# Patient Record
Sex: Male | Born: 2000 | Race: Black or African American | Hispanic: No | Marital: Single | Smoking: Never smoker
Health system: Southern US, Community
[De-identification: ages and names within clinical notes are randomized; demographics above are authoritative.]

## PROBLEM LIST (undated history)

## (undated) DIAGNOSIS — M351 Other overlap syndromes: Secondary | ICD-10-CM

## (undated) DIAGNOSIS — J984 Other disorders of lung: Secondary | ICD-10-CM

---
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
## 2021-01-06 ENCOUNTER — Other Ambulatory Visit (HOSPITAL_COMMUNITY): Payer: Self-pay | Admitting: Internal Medicine

## 2021-01-06 DIAGNOSIS — R7989 Other specified abnormal findings of blood chemistry: Secondary | ICD-10-CM

## 2021-01-19 ENCOUNTER — Ambulatory Visit (HOSPITAL_COMMUNITY)
Admission: RE | Admit: 2021-01-19 | Discharge: 2021-01-19 | Disposition: A | Discharge status: Home / Self Care | Payer: Medicaid Other | Source: Ambulatory Visit | Attending: Internal Medicine | Admitting: Internal Medicine

## 2021-01-19 ENCOUNTER — Other Ambulatory Visit: Payer: Self-pay

## 2021-01-19 DIAGNOSIS — R7989 Other specified abnormal findings of blood chemistry: Secondary | ICD-10-CM | POA: Insufficient documentation

## 2021-04-19 ENCOUNTER — Emergency Department (HOSPITAL_COMMUNITY)
Admission: EM | Admit: 2021-04-19 | Discharge: 2021-04-19 | Disposition: A | Discharge status: Home / Self Care | Payer: Medicaid Other | Attending: Emergency Medicine | Admitting: Emergency Medicine

## 2021-04-19 DIAGNOSIS — R059 Cough, unspecified: Secondary | ICD-10-CM | POA: Diagnosis present

## 2021-04-19 DIAGNOSIS — R000 Tachycardia, unspecified: Secondary | ICD-10-CM | POA: Diagnosis not present

## 2021-04-19 DIAGNOSIS — Z20822 Contact with and (suspected) exposure to covid-19: Secondary | ICD-10-CM | POA: Insufficient documentation

## 2021-04-19 DIAGNOSIS — J069 Acute upper respiratory infection, unspecified: Secondary | ICD-10-CM | POA: Insufficient documentation

## 2021-04-19 LAB — RESP PANEL BY RT-PCR (FLU A&B, COVID) ARPGX2
Influenza A by PCR: NEGATIVE
Influenza B by PCR: NEGATIVE
SARS Coronavirus 2 by RT PCR: NEGATIVE

## 2021-04-19 MED ORDER — BENZONATATE 100 MG PO CAPS: 100.0000 mg | ORAL_CAPSULE | Freq: Three times a day (TID) | ORAL | 0 refills | Status: DC

## 2021-04-19 NOTE — ED Triage Notes (Signed)
Pt to ED via POV, Cough, chest congestion, febrile, reports yellow mucous, ongoing past 3 days. No known sick contacts.

## 2021-04-19 NOTE — Discharge Instructions (Signed)
Likely have a upper viral respiratory infection.  Respiratory panel today was negative for COVID or flu you may have one of the other viruses we did not check for.  Continue taking Tylenol or Motrin for fever control.  I have sent Tessalon pearls for your cough.  If you develop nausea vomiting to the point you are unable to keep any food or drink down please return to the emergency room.

## 2021-04-19 NOTE — ED Provider Notes (Signed)
[facility] EMERGENCY DEPARTMENT Provider Note   CSN: [id number] Arrival date & time: 04/19/21  1057     History Chief Complaint  Patient presents with   Cough   URI    Tevion Nienhuis is a 20 y.o. male.  20 year old male presents today for evaluation of fever, cough, headache, sore throat of 3-day duration.  Patient denies any muscle aches, weakness, nausea, vomiting.  Reports he is drinking fluids but has not increase his fluid intake.  Has not taken anything over-the-counter prior to arrival.  The history is provided by the patient. No language interpreter was used.  URI Presenting symptoms: cough, fever and sore throat   Associated symptoms: headaches   Associated symptoms: no myalgias       No past medical history on file.  There are no problems to display for this patient.   No past surgical history on file.     No family history on file.  Social History   Tobacco Use   Smoking status: Never   Smokeless tobacco: Never    Home Medications Prior to Admission medications   Medication Sig Start Date End Date Taking? Authorizing Provider  ibuprofen (ADVIL,MOTRIN) 400 MG tablet Take 1 tablet (400 mg total) by mouth every 6 (six) hours as needed for moderate pain. 02/10/17   Fayrene Helper, PA-C  pantoprazole (PROTONIX) 20 MG tablet Take 1 tablet (20 mg total) by mouth daily. 06/18/17   Law, Waylan Boga, PA-C  sucralfate (CARAFATE) 1 GM/10ML suspension Take 10 mLs (1 g total) by mouth 4 (four) times daily -  with meals and at bedtime. 06/18/17   Emi Holes, PA-C    Allergies    Patient has no known allergies.  Review of Systems   Review of Systems  Constitutional:  Positive for chills and fever. Negative for activity change.  HENT:  Positive for sore throat.   Respiratory:  Positive for cough. Negative for shortness of breath.   Gastrointestinal:  Negative for abdominal pain, nausea and vomiting.  Musculoskeletal:  Negative for myalgias.  Neurological:   Positive for headaches. Negative for weakness.  All other systems reviewed and are negative.  Physical Exam Updated Vital Signs BP 126/81    Pulse (!) 116    Temp 99.3 F (37.4 C) (Oral)    Resp 16    Ht 6' (1.829 m)    Wt 66.7 kg    SpO2 98%    BMI 19.94 kg/m   Physical Exam Vitals and nursing note reviewed.  Constitutional:      General: He is not in acute distress.    Appearance: Normal appearance. He is not ill-appearing.  HENT:     Head: Normocephalic and atraumatic.     Nose: Nose normal.  Eyes:     Conjunctiva/sclera: Conjunctivae normal.  Cardiovascular:     Rate and Rhythm: Regular rhythm. Tachycardia present.  Pulmonary:     Effort: Pulmonary effort is normal. No respiratory distress.     Breath sounds: No wheezing.  Musculoskeletal:        General: No deformity.     Cervical back: Normal range of motion.  Skin:    Findings: No rash.  Neurological:     Mental Status: He is alert.    ED Results / Procedures / Treatments   Labs (all labs ordered are listed, but only abnormal results are displayed) Labs Reviewed  RESP PANEL BY RT-PCR (FLU A&B, COVID) [id number]    EKG None  Radiology No results  found.  Procedures Procedures   Medications Ordered in ED Medications - No data to display  ED Course  I have reviewed the triage vital signs and the nursing notes.  Pertinent labs & imaging results that were available during my care of the patient were reviewed by me and considered in my medical decision making (see chart for details).    MDM Rules/Calculators/A&P                         20 year old male presents today for evaluation of URI symptoms of 3-day duration.  Patient's respiratory panel was negative for COVID or flu.  Patient is tachycardic.  I did discuss IV hydration however mom states she has another child and has an appointment and needs to get going.  Given that patient is not nauseous and has not vomiting he can hydrate via p.o. fluids.  Patient  is without active fever.  Return precautions discussed.  Mom and patient voiced understanding and are in agreement with plan.     Final Clinical Impression(s) / ED Diagnoses Final diagnoses:  Viral URI with cough    Rx / DC Orders ED Discharge Orders          Ordered    benzonatate (TESSALON) 100 MG capsule  Every 8 hours        04/19/21 1356             Marita Kansas, PA-C 04/19/21 1357    Mancel Bale, MD 04/19/21 1929

## 2021-06-11 ENCOUNTER — Other Ambulatory Visit (HOSPITAL_COMMUNITY): Payer: Self-pay | Admitting: Geriatric Medicine

## 2021-06-11 DIAGNOSIS — K754 Autoimmune hepatitis: Secondary | ICD-10-CM

## 2021-06-22 ENCOUNTER — Other Ambulatory Visit: Payer: Self-pay | Admitting: Radiology

## 2021-06-23 ENCOUNTER — Ambulatory Visit (HOSPITAL_COMMUNITY): Admission: RE | Admit: 2021-06-23 | Payer: Medicaid Other | Source: Ambulatory Visit

## 2021-06-23 ENCOUNTER — Encounter (HOSPITAL_COMMUNITY): Payer: Self-pay

## 2021-06-29 ENCOUNTER — Other Ambulatory Visit: Payer: Self-pay | Admitting: Student

## 2021-06-30 ENCOUNTER — Ambulatory Visit (HOSPITAL_COMMUNITY)
Admission: RE | Admit: 2021-06-30 | Discharge: 2021-06-30 | Disposition: A | Discharge status: Home / Self Care | Payer: Medicaid Other | Source: Ambulatory Visit | Attending: Geriatric Medicine | Admitting: Geriatric Medicine

## 2021-06-30 ENCOUNTER — Encounter (HOSPITAL_COMMUNITY): Payer: Self-pay

## 2021-06-30 NOTE — H&P (Signed)
Chief Complaint: Patient was seen in consultation today for random liver biopsy  at the request of Menon,Radha  Referring Physician(s): Menon,Radha  Supervising Physician: Oley Balm  Patient Status: [facility] - Out-pt  History of Present Illness: David [name] is a 21 y.o. male w/ no significant medical history had recent abnormal LFTs and positive ANA. He has been referred to IR by Dr. Carney Harder for random liver biopsy to r/o possible autoimmune hepatitis.   Allergies: Patient has no known allergies.  Medications: Prior to Admission medications   Medication Sig Start Date End Date Taking? Authorizing Provider  amitriptyline (ELAVIL) 25 MG tablet Take 25 mg by mouth at bedtime.    [provider]  SUMAtriptan (IMITREX) 50 MG tablet Take 50 mg by mouth every 2 (two) hours as needed for migraine. May repeat in 2 hours if headache persists or recurs.    [provider]     No family history on file.  Social History   Socioeconomic History   Marital status: Single    Spouse name: Not on file   Number of children: Not on file   Years of education: Not on file   Highest education level: Not on file  Occupational History   Not on file  Tobacco Use   Smoking status: Never   Smokeless tobacco: Never  Substance and Sexual Activity   Alcohol use: Not on file   Drug use: Not on file   Sexual activity: Not on file  Other Topics Concern   Not on file  Social History Narrative   Not on file   Social Determinants of Health   Financial Resource Strain: Not on file  Food Insecurity: Not on file  Transportation Needs: Not on file  Physical Activity: Not on file  Stress: Not on file  Social Connections: Not on file    Review of Systems: A 12 point ROS discussed and pertinent positives are indicated in the HPI above.  All other systems are negative.  Review of Systems  All other systems reviewed and are negative.  Vital Signs: There were no vitals  taken for this visit.  Physical Exam Constitutional:      Appearance: Normal appearance.  HENT:     Head: Normocephalic and atraumatic.     Mouth/Throat:     Mouth: Mucous membranes are moist.     Pharynx: Oropharynx is clear.  Eyes:     Extraocular Movements: Extraocular movements intact.     Pupils: Pupils are equal, round, and reactive to light.  Cardiovascular:     Rate and Rhythm: Normal rate and regular rhythm.     Pulses: Normal pulses.     Heart sounds: Normal heart sounds.  Pulmonary:     Effort: No respiratory distress.     Breath sounds: Normal breath sounds.  Abdominal:     General: Bowel sounds are normal. There is no distension.     Tenderness: There is no abdominal tenderness. There is no guarding.  Musculoskeletal:     Right lower leg: No edema.     Left lower leg: No edema.  Skin:    General: Skin is warm and dry.  Neurological:     Mental Status: He is alert and oriented to person, place, and time.  Psychiatric:        Mood and Affect: Mood normal.        Thought Content: Thought content normal.        Judgment: Judgment normal.  Imaging: No results found.  Labs:  CBC: No results for input(s): WBC, HGB, HCT, PLT in the last 8760 hours.  COAGS: No results for input(s): INR, APTT in the last 8760 hours.  BMP: No results for input(s): NA, K, CL, CO2, GLUCOSE, BUN, CALCIUM, CREATININE, GFRNONAA, GFRAA in the last 8760 hours.  Invalid input(s): CMP  LIVER FUNCTION TESTS: No results for input(s): BILITOT, AST, ALT, ALKPHOS, PROT, ALBUMIN in the last 8760 hours.  TUMOR MARKERS: No results for input(s): AFPTM, CEA, CA199, CHROMGRNA in the last 8760 hours.  Assessment and Plan: Pt with no significant medical history had  recent abnormal LFTs and positive ANA. He has been referred to IR by Dr. Carney Harder for random liver biopsy to r/o possible autoimmune hepatitis   Pt sitting upright on stretcher.  He is A&O, calm and pleasant. He is in no  distress.  Pt states he is NPO per order. He denies the use of blood thinning medications.  Risks and benefits of random liver biopsy was discussed with the patient and/or patient's family including, but not limited to bleeding, infection, damage to adjacent structures or low yield requiring additional tests.  All of the questions were answered and there is agreement to proceed.  Consent signed and in chart.   Thank you for this interesting consult.  I greatly enjoyed meeting David [name] and look forward to participating in their care.  A copy of this report was sent to the requesting provider on this date.  Electronically Signed: Shon Hough, NP 07/06/2021, 11:26 AM   I spent a total of 20 minutes in face to face in clinical consultation, greater than 50% of which was counseling/coordinating care for random liver biopsy.

## 2021-07-05 ENCOUNTER — Other Ambulatory Visit: Payer: Self-pay | Admitting: Radiology

## 2021-07-06 ENCOUNTER — Ambulatory Visit (HOSPITAL_COMMUNITY)
Admission: RE | Admit: 2021-07-06 | Discharge: 2021-07-06 | Disposition: A | Discharge status: Home / Self Care | Payer: Medicaid Other | Source: Ambulatory Visit | Attending: Geriatric Medicine | Admitting: Geriatric Medicine

## 2021-07-06 ENCOUNTER — Encounter (HOSPITAL_COMMUNITY): Payer: Self-pay

## 2021-07-06 ENCOUNTER — Other Ambulatory Visit: Payer: Self-pay

## 2021-07-06 DIAGNOSIS — K754 Autoimmune hepatitis: Secondary | ICD-10-CM | POA: Insufficient documentation

## 2021-07-06 LAB — CBC
HCT: 39.7 % (ref 39.0–52.0)
Hemoglobin: 13.1 g/dL (ref 13.0–17.0)
MCH: 29.5 pg (ref 26.0–34.0)
MCHC: 33 g/dL (ref 30.0–36.0)
MCV: 89.4 fL (ref 80.0–100.0)
Platelets: 146 10*3/uL — ABNORMAL LOW (ref 150–400)
RBC: 4.44 MIL/uL (ref 4.22–5.81)
RDW: 14.6 % (ref 11.5–15.5)
WBC: 6 10*3/uL (ref 4.0–10.5)
nRBC: 0 % (ref 0.0–0.2)

## 2021-07-06 LAB — PROTIME-INR
INR: 1.2 (ref 0.8–1.2)
Prothrombin Time: 14.9 seconds (ref 11.4–15.2)

## 2021-07-06 MED ORDER — FENTANYL CITRATE (PF) 100 MCG/2ML IJ SOLN
INTRAMUSCULAR | Status: AC
  Filled 2021-07-06: qty 2

## 2021-07-06 MED ORDER — SODIUM CHLORIDE 0.9 % IV SOLN: INTRAVENOUS | Status: DC

## 2021-07-06 MED ORDER — MIDAZOLAM HCL 2 MG/2ML IJ SOLN
INTRAMUSCULAR | Status: AC | PRN
  Administered 2021-07-06: 1 mg via INTRAVENOUS
  Administered 2021-07-06: .5 mg via INTRAVENOUS

## 2021-07-06 MED ORDER — MIDAZOLAM HCL 2 MG/2ML IJ SOLN
INTRAMUSCULAR | Status: AC
  Filled 2021-07-06: qty 2

## 2021-07-06 MED ORDER — LIDOCAINE HCL (PF) 1 % IJ SOLN
INTRAMUSCULAR | Status: AC
  Filled 2021-07-06: qty 30

## 2021-07-06 MED ORDER — FENTANYL CITRATE (PF) 100 MCG/2ML IJ SOLN
INTRAMUSCULAR | Status: AC | PRN
  Administered 2021-07-06 (×2): 25 ug via INTRAVENOUS

## 2021-07-06 MED ORDER — HYDROCODONE-ACETAMINOPHEN 5-325 MG PO TABS: 1.0000 | ORAL_TABLET | ORAL | Status: DC | PRN

## 2021-07-06 MED ORDER — GELATIN ABSORBABLE 12-7 MM EX MISC
CUTANEOUS | Status: AC
  Filled 2021-07-06: qty 1

## 2021-07-06 NOTE — Sedation Documentation (Signed)
EtCO2 monitor is not working in [place]. Unable to monitor EtCO2 for procedure. [place] supervisor made aware.  ?

## 2021-07-06 NOTE — Procedures (Signed)
  Procedure: US core liver biopsy   EBL:   minimal Complications:  none immediate  See full dictation in [facility] PACS.  D. Akosua Constantine MD Main # [phone] Pager  [phone] Mobile [phone]     

## 2021-07-08 LAB — SURGICAL PATHOLOGY

## 2021-10-21 ENCOUNTER — Other Ambulatory Visit: Payer: Self-pay

## 2021-10-21 ENCOUNTER — Encounter: Payer: Self-pay | Admitting: Internal Medicine

## 2021-10-21 ENCOUNTER — Ambulatory Visit (INDEPENDENT_AMBULATORY_CARE_PROVIDER_SITE_OTHER): Payer: Medicaid Other | Admitting: Internal Medicine

## 2021-10-21 DIAGNOSIS — B2790 Infectious mononucleosis, unspecified without complication: Secondary | ICD-10-CM | POA: Insufficient documentation

## 2021-10-21 DIAGNOSIS — R634 Abnormal weight loss: Secondary | ICD-10-CM | POA: Diagnosis not present

## 2021-10-21 DIAGNOSIS — R748 Abnormal levels of other serum enzymes: Secondary | ICD-10-CM | POA: Insufficient documentation

## 2021-10-21 NOTE — Progress Notes (Signed)
[facility]  Reason for Consult: Possible recent David [name] Referring Provider: Alesia Morin, ANP  Assessment: His EBV serology reveals [name] at some time in the past but I am not convinced that that is the cause of his recent liver enzyme elevation, unintentional weight loss, fever, fatigue, cough with hemoptysis, neuropathy and arthralgias.  He would be unusual for EBV to cause such severe and protracted symptoms.  I will repeat his liver enzymes today and he has signed a release of information to see if we can get complete records from his primary care physician and [facility].  Plan: CBC, CMP, HIV viral load and RPR Attempt to obtain all recent medical records Follow-up in 2 weeks  Patient Active Problem List   Diagnosis Date Noted   David [name] 10/21/2021    Patient's Medications  New Prescriptions   No medications on file  Previous Medications   AMITRIPTYLINE (ELAVIL) 25 MG TABLET    Take 25 mg by mouth at bedtime.   SUMATRIPTAN (IMITREX) 50 MG TABLET    Take 50 mg by mouth every 2 (two) hours as needed for migraine. May repeat in 2 hours if headache persists or recurs.  Modified Medications   No medications on file  Discontinued Medications   No medications on file    HPI: David [name] is a 21 y.o. male who emigrated here from his native [place] when he was 57 years old.  His mother tells me that when he was very little he had lots of "tummy aches".  About 5 years ago he began to struggle with depression and migraine headaches.  About 3 years ago he also had an episode of severe abdominal pain.  He was evaluated on 1 occasion and was told that he had an ulcer.  He was given some liquid medicine to take in his pain slowly improved.  He does not recall having any scans and never underwent EGD.  At some point last year he thinks he was started on sumatriptan and amitriptyline for his  headaches.  His headaches became less frequent and severe but he was told to stop the medications when it was noted that his liver enzymes were elevated last year.  He underwent an abdominal ultrasound in September that did not reveal any abnormalities.  He says that he underwent other testing and that everything was negative for hepatitis.  He was referred to [facility] where he underwent further testing.  A liver biopsy was performed in March which was essentially normal.  He underwent a CT scan of his abdomen and pelvis recently that showed only a small hiatal hernia but was otherwise normal.  He tells me that he has not felt completely well for the last 4 years.  He says that he started to feel worse in January of last year.  In addition to the headaches and elevated liver enzymes he has been losing weight unintentionally.  He says that he used to weigh 160 pounds but now he has stabilized around 130 pounds even though he is eating more to try to gain weight.  He has not had any nausea, vomiting or diarrhea.  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  He has continued taking courses as a sophomore at [employer].  For the last several months he had  episodes of fever, chills and sweats but he believes they have stopped recently.  He was also having problems with cough, chest pain and occasional hemoptysis but he thinks this has stopped as well.  He has also had diffuse aching in his joints.  He used to smoke marijuana but stopped abruptly several months ago.  He denies use of any other recreational drugs.  He has been sexually active but denies ever having any known sexually transmitted infections.  He recently had 2 visits at the cancer center at [facility].  His most recent liver enzymes are down slightly.  However his LDH was quite elevated at 760. His albumin was low and his total protein was elevated.  His  HIV antigen and antibody tests are negative.  He tells me that he believes he had a normal chest x-ray as well.  Epstein-Barr virus serology was obtained which showed a negative viral capsid antigen IgM, positive viral capsid antigen IgG and positive nuclear antigen IgG.  He was referred here for further evaluation.  Review of Systems: Review of Systems  Constitutional:  Positive for chills, diaphoresis, fever, malaise/fatigue and weight loss.  HENT:  Negative for congestion and sore throat.   Eyes:  Negative for blurred vision and redness.  Respiratory:  Positive for cough, hemoptysis and sputum production. Negative for shortness of breath and wheezing.   Cardiovascular:  Positive for chest pain.  Gastrointestinal:  Negative for abdominal pain, diarrhea, nausea and vomiting.  Genitourinary:  Negative for dysuria.  Musculoskeletal:  Positive for joint pain. Negative for myalgias.  Skin:  Negative for rash.  Neurological:  Positive for tingling and headaches. Negative for focal weakness.  Psychiatric/Behavioral:  Positive for depression. The patient is not nervous/anxious and does not have insomnia.       No past medical history on file.  Social History   Tobacco Use   Smoking status: Never   Smokeless tobacco: Never    No family history on file. No Known Allergies  OBJECTIVE: Vitals:   10/21/21 1440  Weight: 130 lb (59 kg)   Body mass index is 18.13 kg/m.   Physical Exam Constitutional:      Comments: He is soft-spoken and pleasant.  He is accompanied by his mother.  HENT:     Mouth/Throat:     Mouth: Mucous membranes are moist.     Pharynx: Oropharynx is clear. No oropharyngeal exudate or posterior oropharyngeal erythema.  Eyes:     Conjunctiva/sclera: Conjunctivae normal.     Pupils: Pupils are equal, round, and reactive to light.  Cardiovascular:     Rate and Rhythm: Normal rate and regular rhythm.     Heart sounds: No murmur heard. Pulmonary:     Effort:  Pulmonary effort is normal.     Breath sounds: Normal breath sounds. No wheezing, rhonchi or rales.  Abdominal:     Palpations: Abdomen is soft.     Tenderness: There is no abdominal tenderness.  Musculoskeletal:        General: No swelling or tenderness.     Cervical back: Neck supple.     Right lower leg: No edema.     Left lower leg: No edema.  Lymphadenopathy:     Head:     Right side of head: No submental or submandibular adenopathy.     Left side of head: No submental or submandibular adenopathy.     Cervical: No cervical adenopathy.     Upper Body:     Right upper body:  Axillary adenopathy and epitrochlear adenopathy present. No supraclavicular adenopathy.     Left upper body: Axillary adenopathy and epitrochlear adenopathy present. No supraclavicular adenopathy.  Skin:    Findings: No rash.  Neurological:     General: No focal deficit present.  Psychiatric:        Mood and Affect: Mood normal.     Microbiology: No results found for this or any previous visit (from the past 240 hour(s)).  Cliffton Asters, MD [facility] [phone] pager   [phone] cell 10/21/2021, 2:42 PM

## 2021-10-23 LAB — CBC
HCT: 38.7 % (ref 38.5–50.0)
Hemoglobin: 13 g/dL — ABNORMAL LOW (ref 13.2–17.1)
MCH: 28.9 pg (ref 27.0–33.0)
MCHC: 33.6 g/dL (ref 32.0–36.0)
MCV: 86 fL (ref 80.0–100.0)
MPV: 10 fL (ref 7.5–12.5)
Platelets: 179 10*3/uL (ref 140–400)
RBC: 4.5 10*6/uL (ref 4.20–5.80)
RDW: 14.5 % (ref 11.0–15.0)
WBC: 6.5 10*3/uL (ref 3.8–10.8)

## 2021-10-23 LAB — COMPREHENSIVE METABOLIC PANEL
AG Ratio: 0.7 (calc) — ABNORMAL LOW (ref 1.0–2.5)
ALT: 45 U/L (ref 8–46)
AST: 71 U/L — ABNORMAL HIGH (ref 12–32)
Albumin: 3.7 g/dL (ref 3.6–5.1)
Alkaline phosphatase (APISO): 68 U/L (ref 46–169)
BUN/Creatinine Ratio: 20 (calc) (ref 6–22)
BUN: 9 mg/dL (ref 7–20)
CO2: 27 mmol/L (ref 20–32)
Calcium: 9.2 mg/dL (ref 8.9–10.4)
Chloride: 103 mmol/L (ref 98–110)
Creat: 0.46 mg/dL — ABNORMAL LOW (ref 0.60–1.24)
Globulin: 5.5 g/dL (calc) — ABNORMAL HIGH (ref 2.1–3.5)
Glucose, Bld: 80 mg/dL (ref 65–99)
Potassium: 4.2 mmol/L (ref 3.8–5.1)
Sodium: 135 mmol/L (ref 135–146)
Total Bilirubin: 0.3 mg/dL (ref 0.2–1.1)
Total Protein: 9.2 g/dL — ABNORMAL HIGH (ref 6.3–8.2)

## 2021-10-23 LAB — HIV-1 RNA QUANT-NO REFLEX-BLD
HIV 1 RNA Quant: NOT DETECTED Copies/mL
HIV-1 RNA Quant, Log: NOT DETECTED Log cps/mL

## 2021-10-23 LAB — RPR: RPR Ser Ql: NONREACTIVE

## 2021-11-04 DIAGNOSIS — R5383 Other fatigue: Secondary | ICD-10-CM | POA: Insufficient documentation

## 2021-11-04 DIAGNOSIS — R768 Other specified abnormal immunological findings in serum: Secondary | ICD-10-CM | POA: Insufficient documentation

## 2021-11-04 DIAGNOSIS — R042 Hemoptysis: Secondary | ICD-10-CM | POA: Insufficient documentation

## 2021-11-04 DIAGNOSIS — Z8669 Personal history of other diseases of the nervous system and sense organs: Secondary | ICD-10-CM | POA: Insufficient documentation

## 2021-11-04 DIAGNOSIS — R509 Fever, unspecified: Secondary | ICD-10-CM | POA: Insufficient documentation

## 2021-11-04 DIAGNOSIS — R591 Generalized enlarged lymph nodes: Secondary | ICD-10-CM | POA: Insufficient documentation

## 2021-11-04 DIAGNOSIS — M2550 Pain in unspecified joint: Secondary | ICD-10-CM | POA: Insufficient documentation

## 2021-11-04 DIAGNOSIS — R7402 Elevation of levels of lactic acid dehydrogenase (LDH): Secondary | ICD-10-CM | POA: Insufficient documentation

## 2021-11-09 ENCOUNTER — Ambulatory Visit: Payer: Medicaid Other | Admitting: Internal Medicine

## 2021-11-09 DIAGNOSIS — R509 Fever, unspecified: Secondary | ICD-10-CM

## 2021-11-09 DIAGNOSIS — R5383 Other fatigue: Secondary | ICD-10-CM

## 2021-11-09 DIAGNOSIS — R042 Hemoptysis: Secondary | ICD-10-CM

## 2021-11-09 DIAGNOSIS — R591 Generalized enlarged lymph nodes: Secondary | ICD-10-CM

## 2021-11-09 DIAGNOSIS — R768 Other specified abnormal immunological findings in serum: Secondary | ICD-10-CM

## 2021-11-09 DIAGNOSIS — Z8669 Personal history of other diseases of the nervous system and sense organs: Secondary | ICD-10-CM

## 2021-11-09 DIAGNOSIS — R7402 Elevation of levels of lactic acid dehydrogenase (LDH): Secondary | ICD-10-CM

## 2021-11-09 DIAGNOSIS — M25541 Pain in joints of right hand: Secondary | ICD-10-CM

## 2021-11-23 ENCOUNTER — Ambulatory Visit: Payer: Self-pay | Admitting: Internal Medicine

## 2021-12-04 ENCOUNTER — Observation Stay (HOSPITAL_COMMUNITY): Payer: Self-pay

## 2021-12-04 ENCOUNTER — Encounter (HOSPITAL_COMMUNITY): Payer: Self-pay | Admitting: Emergency Medicine

## 2021-12-04 ENCOUNTER — Other Ambulatory Visit: Payer: Self-pay

## 2021-12-04 ENCOUNTER — Emergency Department (HOSPITAL_COMMUNITY): Payer: Self-pay

## 2021-12-04 ENCOUNTER — Encounter (HOSPITAL_COMMUNITY): Payer: Self-pay

## 2021-12-04 ENCOUNTER — Observation Stay (HOSPITAL_COMMUNITY)
Admission: EM | Admit: 2021-12-04 | Discharge: 2021-12-05 | Disposition: A | Discharge status: Home / Self Care | Payer: Self-pay | Attending: Family Medicine | Admitting: Family Medicine

## 2021-12-04 DIAGNOSIS — R509 Fever, unspecified: Secondary | ICD-10-CM | POA: Diagnosis present

## 2021-12-04 DIAGNOSIS — R0789 Other chest pain: Principal | ICD-10-CM | POA: Insufficient documentation

## 2021-12-04 DIAGNOSIS — R079 Chest pain, unspecified: Secondary | ICD-10-CM | POA: Diagnosis present

## 2021-12-04 DIAGNOSIS — R778 Other specified abnormalities of plasma proteins: Secondary | ICD-10-CM | POA: Insufficient documentation

## 2021-12-04 DIAGNOSIS — R634 Abnormal weight loss: Secondary | ICD-10-CM | POA: Insufficient documentation

## 2021-12-04 DIAGNOSIS — M2550 Pain in unspecified joint: Secondary | ICD-10-CM | POA: Diagnosis present

## 2021-12-04 DIAGNOSIS — M26629 Arthralgia of temporomandibular joint, unspecified side: Secondary | ICD-10-CM | POA: Insufficient documentation

## 2021-12-04 DIAGNOSIS — Z20822 Contact with and (suspected) exposure to covid-19: Secondary | ICD-10-CM | POA: Insufficient documentation

## 2021-12-04 DIAGNOSIS — B349 Viral infection, unspecified: Secondary | ICD-10-CM | POA: Insufficient documentation

## 2021-12-04 DIAGNOSIS — R748 Abnormal levels of other serum enzymes: Secondary | ICD-10-CM | POA: Insufficient documentation

## 2021-12-04 DIAGNOSIS — R531 Weakness: Secondary | ICD-10-CM | POA: Insufficient documentation

## 2021-12-04 LAB — CBC WITH DIFFERENTIAL/PLATELET
Abs Immature Granulocytes: 0.08 10*3/uL — ABNORMAL HIGH (ref 0.00–0.07)
Basophils Absolute: 0 10*3/uL (ref 0.0–0.1)
Basophils Relative: 0 %
Eosinophils Absolute: 0 10*3/uL (ref 0.0–0.5)
Eosinophils Relative: 0 %
HCT: 36.7 % — ABNORMAL LOW (ref 39.0–52.0)
Hemoglobin: 12.1 g/dL — ABNORMAL LOW (ref 13.0–17.0)
Immature Granulocytes: 1 %
Lymphocytes Relative: 20 %
Lymphs Abs: 3.5 10*3/uL (ref 0.7–4.0)
MCH: 28.4 pg (ref 26.0–34.0)
MCHC: 33 g/dL (ref 30.0–36.0)
MCV: 86.2 fL (ref 80.0–100.0)
Monocytes Absolute: 1.3 10*3/uL — ABNORMAL HIGH (ref 0.1–1.0)
Monocytes Relative: 8 %
Neutro Abs: 12.7 10*3/uL — ABNORMAL HIGH (ref 1.7–7.7)
Neutrophils Relative %: 71 %
Platelets: 188 10*3/uL (ref 150–400)
RBC: 4.26 MIL/uL (ref 4.22–5.81)
RDW: 14.6 % (ref 11.5–15.5)
WBC: 17.6 10*3/uL — ABNORMAL HIGH (ref 4.0–10.5)
nRBC: 0 % (ref 0.0–0.2)

## 2021-12-04 LAB — URINALYSIS, ROUTINE W REFLEX MICROSCOPIC
Bilirubin Urine: NEGATIVE
Glucose, UA: NEGATIVE mg/dL
Hgb urine dipstick: NEGATIVE
Ketones, ur: 80 mg/dL — AB
Leukocytes,Ua: NEGATIVE
Nitrite: NEGATIVE
Protein, ur: 100 mg/dL — AB
Specific Gravity, Urine: 1.019 (ref 1.005–1.030)
pH: 6 (ref 5.0–8.0)

## 2021-12-04 LAB — C-REACTIVE PROTEIN: CRP: 23.6 mg/dL — ABNORMAL HIGH (ref <1.0)

## 2021-12-04 LAB — COMPREHENSIVE METABOLIC PANEL
ALT: 54 U/L — ABNORMAL HIGH (ref 0–44)
AST: 69 U/L — ABNORMAL HIGH (ref 15–41)
Albumin: 3.2 g/dL — ABNORMAL LOW (ref 3.5–5.0)
Alkaline Phosphatase: 67 U/L (ref 38–126)
Anion gap: 9 (ref 5–15)
BUN: 8 mg/dL (ref 6–20)
CO2: 24 mmol/L (ref 22–32)
Calcium: 8.3 mg/dL — ABNORMAL LOW (ref 8.9–10.3)
Chloride: 97 mmol/L — ABNORMAL LOW (ref 98–111)
Creatinine, Ser: 0.54 mg/dL — ABNORMAL LOW (ref 0.61–1.24)
GFR, Estimated: >60 mL/min (ref >60)
Glucose, Bld: 117 mg/dL — ABNORMAL HIGH (ref 70–99)
Potassium: 3.8 mmol/L (ref 3.5–5.1)
Sodium: 130 mmol/L — ABNORMAL LOW (ref 135–145)
Total Bilirubin: 0.8 mg/dL (ref 0.3–1.2)
Total Protein: 9.3 g/dL — ABNORMAL HIGH (ref 6.5–8.1)

## 2021-12-04 LAB — SEDIMENTATION RATE: Sed Rate: 92 mm/hr — ABNORMAL HIGH (ref 0–16)

## 2021-12-04 LAB — PROCALCITONIN: Procalcitonin: 0.13 ng/mL

## 2021-12-04 LAB — GROUP A STREP BY PCR: Group A Strep by PCR: NOT DETECTED

## 2021-12-04 LAB — PROTIME-INR
INR: 1.4 — ABNORMAL HIGH (ref 0.8–1.2)
Prothrombin Time: 16.9 seconds — ABNORMAL HIGH (ref 11.4–15.2)

## 2021-12-04 LAB — RESP PANEL BY RT-PCR (FLU A&B, COVID) ARPGX2
Influenza A by PCR: NEGATIVE
Influenza B by PCR: NEGATIVE
SARS Coronavirus 2 by RT PCR: NEGATIVE

## 2021-12-04 LAB — MONONUCLEOSIS SCREEN: Mono Screen: NEGATIVE

## 2021-12-04 LAB — TROPONIN I (HIGH SENSITIVITY)
Troponin I (High Sensitivity): 59 ng/L — ABNORMAL HIGH (ref <18)
Troponin I (High Sensitivity): 51 ng/L — ABNORMAL HIGH (ref <18)

## 2021-12-04 LAB — TSH: TSH: 1.992 u[IU]/mL (ref 0.350–4.500)

## 2021-12-04 LAB — LACTIC ACID, PLASMA: Lactic Acid, Venous: 1.1 mmol/L (ref 0.5–1.9)

## 2021-12-04 LAB — APTT: aPTT: 35 seconds (ref 24–36)

## 2021-12-04 MED ORDER — HEPARIN (PORCINE) 25000 UT/250ML-% IV SOLN
650.0000 [IU]/h | INTRAVENOUS | Status: DC
Start: 2021-12-04 — End: 2021-12-04

## 2021-12-04 MED ORDER — IOHEXOL 350 MG/ML SOLN
75.0000 mL | Freq: Once | INTRAVENOUS | Status: AC | PRN
  Administered 2021-12-04: 75 mL via INTRAVENOUS

## 2021-12-04 MED ORDER — TECHNETIUM TO 99M ALBUMIN AGGREGATED
4.0000 | Freq: Once | INTRAVENOUS | Status: AC | PRN
  Administered 2021-12-04: 4.4 via INTRAVENOUS

## 2021-12-04 MED ORDER — HEPARIN (PORCINE) 25000 UT/250ML-% IV SOLN
950.0000 [IU]/h | INTRAVENOUS | Status: DC
  Administered 2021-12-04: 950 [IU]/h via INTRAVENOUS
  Filled 2021-12-04: qty 250

## 2021-12-04 MED ORDER — ORAL CARE MOUTH RINSE: 15.0000 mL | OROMUCOSAL | Status: DC | PRN

## 2021-12-04 MED ORDER — SODIUM CHLORIDE 0.9% FLUSH
3.0000 mL | Freq: Two times a day (BID) | INTRAVENOUS | Status: DC
  Administered 2021-12-04: 3 mL via INTRAVENOUS

## 2021-12-04 MED ORDER — IBUPROFEN 800 MG PO TABS
800.0000 mg | ORAL_TABLET | Freq: Once | ORAL | Status: AC
Start: 2021-12-04 — End: 2021-12-04
  Administered 2021-12-04: 800 mg via ORAL
  Filled 2021-12-04: qty 1

## 2021-12-04 MED ORDER — MIRTAZAPINE 15 MG PO TABS
15.0000 mg | ORAL_TABLET | Freq: Every day | ORAL | Status: DC
  Administered 2021-12-04: 15 mg via ORAL
  Filled 2021-12-04: qty 1

## 2021-12-04 MED ORDER — BISACODYL 10 MG RE SUPP: 10.0000 mg | Freq: Every day | RECTAL | Status: DC | PRN

## 2021-12-04 MED ORDER — ACETAMINOPHEN 650 MG RE SUPP: 650.0000 mg | Freq: Four times a day (QID) | RECTAL | Status: DC | PRN

## 2021-12-04 MED ORDER — ONDANSETRON HCL 4 MG PO TABS: 4.0000 mg | ORAL_TABLET | Freq: Four times a day (QID) | ORAL | Status: DC | PRN

## 2021-12-04 MED ORDER — IBUPROFEN 400 MG PO TABS
800.0000 mg | ORAL_TABLET | Freq: Three times a day (TID) | ORAL | Status: DC
  Administered 2021-12-04 – 2021-12-05 (×3): 800 mg via ORAL
  Filled 2021-12-04 (×3): qty 2

## 2021-12-04 MED ORDER — LACTATED RINGERS IV BOLUS (SEPSIS)
1000.0000 mL | Freq: Once | INTRAVENOUS | Status: AC
  Administered 2021-12-04: 1000 mL via INTRAVENOUS

## 2021-12-04 MED ORDER — SODIUM CHLORIDE 0.9% FLUSH
3.0000 mL | Freq: Two times a day (BID) | INTRAVENOUS | Status: DC
Start: 2021-12-04 — End: 2021-12-05
  Administered 2021-12-04 – 2021-12-05 (×2): 3 mL via INTRAVENOUS

## 2021-12-04 MED ORDER — LACTATED RINGERS IV SOLN: INTRAVENOUS | Status: DC

## 2021-12-04 MED ORDER — POLYETHYLENE GLYCOL 3350 17 G PO PACK: 17.0000 g | PACK | Freq: Every day | ORAL | Status: DC | PRN

## 2021-12-04 MED ORDER — ONDANSETRON HCL 4 MG/2ML IJ SOLN: 4.0000 mg | Freq: Four times a day (QID) | INTRAMUSCULAR | Status: DC | PRN

## 2021-12-04 MED ORDER — ENOXAPARIN SODIUM 40 MG/0.4ML IJ SOSY
40.0000 mg | PREFILLED_SYRINGE | INTRAMUSCULAR | Status: DC
  Administered 2021-12-04: 40 mg via SUBCUTANEOUS
  Filled 2021-12-04: qty 0.4

## 2021-12-04 MED ORDER — ACETAMINOPHEN 325 MG PO TABS
650.0000 mg | ORAL_TABLET | Freq: Once | ORAL | Status: AC
  Administered 2021-12-04: 650 mg via ORAL
  Filled 2021-12-04: qty 2

## 2021-12-04 MED ORDER — ACETAMINOPHEN 325 MG PO TABS: 650.0000 mg | ORAL_TABLET | Freq: Four times a day (QID) | ORAL | Status: DC | PRN

## 2021-12-04 MED ORDER — SODIUM CHLORIDE 0.9 % IV SOLN
INTRAVENOUS | Status: DC | PRN
Start: 2021-12-04 — End: 2021-12-05

## 2021-12-04 MED ORDER — HEPARIN BOLUS VIA INFUSION
4000.0000 [IU] | Freq: Once | INTRAVENOUS | Status: AC
  Administered 2021-12-04: 4000 [IU] via INTRAVENOUS

## 2021-12-04 MED ORDER — SODIUM CHLORIDE 0.9% FLUSH: 3.0000 mL | INTRAVENOUS | Status: DC | PRN

## 2021-12-04 NOTE — ED Provider Notes (Signed)
[facility] EMERGENCY DEPARTMENT Provider Note   CSN: [id number] Arrival date & time: 12/04/21  0846     History  Chief Complaint  Patient presents with   Chest Pain    David [name] is a 21 y.o. male.   Chest Pain Associated symptoms: cough, fatigue, fever and headache   Associated symptoms: no abdominal pain, no dizziness, no nausea, no numbness, no shortness of breath, no vomiting and no weakness        David [name] is a 21 y.o. male with past medical history of migraine headaches who presents to the Emergency Department complaining of mid chest pain and arthralgias.  Symptoms began 2 days ago.  He states that he began noticing aching pains of all of his joints.  He began having chest pain shortly after.  Mild cough and mother states that he "felt feverish" yesterday.  States that he stayed in bed most of the day yesterday.  She states this is unlike him.  Patient states cough is mostly been nonproductive, chest pain worsens when lying down.  he also complains of sore throat and headache.  No shortness of breath.  Headache has been gradual in onset and frontal in location.  He states headache feels similar to prior headaches.  Denies abdominal pain, dysuria, vomiting or diarrhea.  States that his neck feels sore as well.  Denies any sick contacts, mother states no other family members or ill. No known tick bites, denies IV drug use  Home Medications Prior to Admission medications   Medication Sig Start Date End Date Taking? Authorizing Provider  amitriptyline (ELAVIL) 25 MG tablet Take 25 mg by mouth at bedtime. Patient not taking: Reported on 10/21/2021    [provider]  SUMAtriptan (IMITREX) 50 MG tablet Take 50 mg by mouth every 2 (two) hours as needed for migraine. May repeat in 2 hours if headache persists or recurs. Patient not taking: Reported on 10/21/2021    [provider]      Allergies    Patient has no known allergies.    Review of Systems    Review of Systems  Constitutional:  Positive for activity change, fatigue and fever.  HENT:  Positive for sore throat. Negative for congestion, ear pain and voice change.   Respiratory:  Positive for cough. Negative for chest tightness and shortness of breath.   Cardiovascular:  Positive for chest pain. Negative for leg swelling.  Gastrointestinal:  Negative for abdominal pain, diarrhea, nausea and vomiting.  Genitourinary:  Negative for difficulty urinating and dysuria.  Musculoskeletal:  Positive for arthralgias, neck pain and neck stiffness. Negative for joint swelling.  Skin:  Negative for rash.  Neurological:  Positive for headaches. Negative for dizziness, syncope, speech difficulty, weakness and numbness.  Psychiatric/Behavioral:  Negative for confusion.     Physical Exam Updated Vital Signs BP 128/79   Pulse (!) 140   Temp (!) 102.7 F (39.3 C) (Oral)   Resp (!) 26   Ht 6' (1.829 m)   Wt 59 kg   SpO2 100%   BMI 17.64 kg/m  Physical Exam Vitals and nursing note reviewed.  Constitutional:      Appearance: He is ill-appearing. He is not toxic-appearing.  HENT:     Mouth/Throat:     Mouth: Mucous membranes are moist.     Pharynx: Uvula midline. Posterior oropharyngeal erythema present. No pharyngeal swelling or uvula swelling.     Tonsils: No tonsillar exudate.  Eyes:     Conjunctiva/sclera: Conjunctivae  normal.     Pupils: Pupils are equal, round, and reactive to light.  Cardiovascular:     Rate and Rhythm: Regular rhythm. Tachycardia present.     Pulses: Normal pulses.  Pulmonary:     Effort: Pulmonary effort is normal. No respiratory distress.     Breath sounds: No wheezing.  Abdominal:     General: There is no distension.     Palpations: Abdomen is soft.     Tenderness: There is no abdominal tenderness.  Musculoskeletal:     Cervical back: Rigidity present.     Right lower leg: No edema.     Left lower leg: No edema.  Skin:    General: Skin is warm.      Capillary Refill: Capillary refill takes less than 2 seconds.     Findings: No rash.  Neurological:     General: No focal deficit present.     Mental Status: He is alert.     Sensory: No sensory deficit.     Motor: No weakness.     ED Results / Procedures / Treatments   Labs (all labs ordered are listed, but only abnormal results are displayed) Labs Reviewed  COMPREHENSIVE METABOLIC PANEL - Abnormal; Notable for the following components:      Result Value   Sodium 130 (*)    Chloride 97 (*)    Glucose, Bld 117 (*)    Creatinine, Ser 0.54 (*)    Calcium 8.3 (*)    Total Protein 9.3 (*)    Albumin 3.2 (*)    AST 69 (*)    ALT 54 (*)    All other components within normal limits  CBC WITH DIFFERENTIAL/PLATELET - Abnormal; Notable for the following components:   WBC 17.6 (*)    Hemoglobin 12.1 (*)    HCT 36.7 (*)    Neutro Abs 12.7 (*)    Monocytes Absolute 1.3 (*)    Abs Immature Granulocytes 0.08 (*)    All other components within normal limits  PROTIME-INR - Abnormal; Notable for the following components:   Prothrombin Time 16.9 (*)    INR 1.4 (*)    All other components within normal limits  URINALYSIS, ROUTINE W REFLEX MICROSCOPIC - Abnormal; Notable for the following components:   Ketones, ur 80 (*)    Protein, ur 100 (*)    Bacteria, UA RARE (*)    All other components within normal limits  TROPONIN I (HIGH SENSITIVITY) - Abnormal; Notable for the following components:   Troponin I (High Sensitivity) 59 (*)    All other components within normal limits  RESP PANEL BY RT-PCR (FLU A&B, COVID) ARPGX2  GROUP A STREP BY PCR  CULTURE, BLOOD (ROUTINE X 2)  CULTURE, BLOOD (ROUTINE X 2)  URINE CULTURE  LACTIC ACID, PLASMA  APTT  MONONUCLEOSIS SCREEN    EKG EKG Interpretation  Date/Time:  Saturday December 04 2021 09:01:27 EDT Ventricular Rate:  139 PR Interval:  143 QRS Duration: 89 QT Interval:  277 QTC Calculation: 422 R Axis:   81 Text Interpretation: Sinus  tachycardia Atrial premature complex Consider right atrial enlargement Borderline T wave abnormalities Borderline ST elevation, inferior leads Confirmed by Alona Bene [id number]) on 12/04/2021 9:21:48 AM  Radiology CT Angio Chest PE W and/or Wo Contrast  Result Date: 12/04/2021 CLINICAL DATA:  21 year old male with left side chest pain, joint pain. EXAM: CT ANGIOGRAPHY CHEST WITH CONTRAST TECHNIQUE: Multidetector CT imaging of the chest was performed using the standard protocol during  bolus administration of intravenous contrast. Multiplanar CT image reconstructions and MIPs were obtained to evaluate the vascular anatomy. RADIATION DOSE REDUCTION: This exam was performed according to the departmental dose-optimization program which includes automated exposure control, adjustment of the mA and/or kV according to patient size and/or use of iterative reconstruction technique. CONTRAST:  36mL OMNIPAQUE IOHEXOL 350 MG/ML SOLN COMPARISON:  Portable chest 0931 hours today. FINDINGS: Cardiovascular: Good contrast bolus timing in the pulmonary arterial tree. Respiratory motion. Difficult to exclude nonocclusive thrombus in a segmental branch of the left lower lobe on series 5, image 137. But a nearby bifurcation and the immediate downstream branches appear patent. And no other pulmonary artery filling defect is identified. Moderate circumferential pericardial effusion, up to 15 mm in thickness. Pericardial fluid measures low to intermediate density. Negative visible aorta. Mediastinum/Nodes: Reactive appearing small mediastinal and axillary lymph nodes, individually 10-12 mm short axis. Lungs/Pleura: Trace layering pleural fluid bilaterally. Mild dependent atelectasis, most pronounced in the left costophrenic angle. Major airways are patent. No pulmonary contusion or consolidation to suggest infarct or pneumonia. Upper Abdomen: Negative visible early contrast enhanced liver, spleen, pancreas, adrenal glands and kidneys.  Negative visible bowel in the upper abdomen. Musculoskeletal: Mild to moderate dextroconvex scoliosis. Otherwise negative. Review of the MIP images confirms the above findings. IMPRESSION: 1. Respiratory motion with a questionable small solitary nonocclusive pulmonary embolus in a segmental branch of the left lower lobe. But favor this is artifact. 2. Positive for up to Moderate Pericardial Effusion. Consider Pericarditis. 3. Reactive appearing axillary and mediastinal lymph nodes, and trace bilateral pleural fluid. Dependent opacity most pronounced in the left costophrenic angle. Favor atelectasis rather than pulmonary infarct or inflammation. Electronically Signed   By: Odessa Fleming M.D.   On: 12/04/2021 11:58   DG Chest Port 1 View  Result Date: 12/04/2021 CLINICAL DATA:  Questionable sepsis.  Chest pain. EXAM: PORTABLE CHEST 1 VIEW COMPARISON:  None Available. FINDINGS: The heart size and mediastinal contours are within normal limits. Both lungs are clear. The visualized skeletal structures are unremarkable. IMPRESSION: No active disease. Electronically Signed   By: Gerome Sam III M.D.   On: 12/04/2021 09:38    Procedures Procedures    Medications Ordered in ED Medications  lactated ringers infusion (has no administration in time range)  lactated ringers bolus 1,000 mL (has no administration in time range)  ibuprofen (ADVIL) tablet 800 mg (has no administration in time range)    ED Course/ Medical Decision Making/ A&P                           Medical Decision Making Patient here with complaints of mid chest pain and arthralgias with cough, headache and sore throat.  Symptoms began 2 days ago.  Subjective fever at home.  Cough mostly nonproductive.  No history of sick contacts or IV drug use  On exam, patient ill-appearing, tachycardic and febrile at 102.7.  No focal neurodeficits.  He does have a mildly erythematous oropharynx without exudate.  No evidence of peritonsillar abscess. No  definite meningeal signs.  Patient ill-appearing and concerning for developing sepsis.  His differential at this time includes but not limited to viral process, COVID, pneumonia, ACS, PE, meningitis.    Amount and/or Complexity of Data Reviewed Labs: ordered.    Details: Labs interpreted by me, show leukocytosis with white count of 17,000, hemoglobin 12.1 and platelets are reassuring.  INR 1.4.  Chemistries show mild hyponatremia with sodium of  130.  Blood sugar 117.  Kidney function without presence of AKI urinalysis without evidence of infection, lactic acid unremarkable at 1.1, COVID flu mono and strep testing all negative. Radiology: ordered.    Details: Chest x-ray without acute cardiopulmonary disease  CT angio of chest shows questionable mild nonocclusive pulmonary embolus but favored represent artifact.  Positive for moderate pericardial effusion, possible pericarditis ECG/medicine tests: ordered.    Details: EKG shows sinus tachycardia with right atrial enlargement, borderline T wave abnormalities, borderline ST elevation inferior leads Discussion of management or test interpretation with external provider(s): Discussed findings with cardiology, Dr. Ladona Ridgel.  Felt that effusion does not need drainage at this time, patient will need echocardiogram and recommends hospitalization for observation  Consulted [facility] hospitalist, Dr. Mariea Clonts who is agreeable to admit  Risk Prescription drug management.   CRITICAL CARE Performed by: Cynthia Cogle Total critical care time: 40 minutes Critical care time was exclusive of separately billable procedures and treating other patients. Critical care was necessary to treat or prevent imminent or life-threatening deterioration. Critical care was time spent personally by me on the following activities: development of treatment plan with patient and/or surrogate as well as nursing, discussions with consultants, evaluation of patient's response to  treatment, examination of patient, obtaining history from patient or surrogate, ordering and performing treatments and interventions, ordering and review of laboratory studies, ordering and review of radiographic studies, pulse oximetry and re-evaluation of patient's condition.          Final Clinical Impression(s) / ED Diagnoses Final diagnoses:  Atypical chest pain  Viral illness    Rx / DC Orders ED Discharge Orders     None         Pauline Aus, PA-C 12/04/21 1405    Long, Arlyss Repress, MD 12/05/21 1815

## 2021-12-04 NOTE — Progress Notes (Signed)
   12/04/21 2144  Assess: if the MEWS score is Yellow or Red  Were vital signs taken at a resting state? Yes  Focused Assessment No change from prior assessment  Does the patient meet 2 or more of the SIRS criteria? No  MEWS guidelines implemented *See Row Information* Yes  Treat  MEWS Interventions Administered scheduled meds/treatments  Pain Scale 0-10  Pain Score 3  Pain Type Acute pain  Pain Location Chest  Pain Orientation Left  Pain Descriptors / Indicators Aching  Pain Frequency Constant  Pain Onset On-going  Patients Stated Pain Goal 0  Multiple Pain Sites No  Take Vital Signs  Increase Vital Sign Frequency  Yellow: Q 2hr X 2 then Q 4hr X 2, if remains yellow, continue Q 4hrs  Escalate  MEWS: Escalate Yellow: discuss with charge nurse/RN and consider discussing with provider and RRT  Notify: Charge Nurse/RN  Name of Charge Nurse/RN Notified Tiffany, RN  Date Charge Nurse/RN Notified 12/04/21  Time Charge Nurse/RN Notified 2146

## 2021-12-04 NOTE — H&P (Signed)
Patient Demographics:    David [name], is a 21 y.o. male  MRN: [id number]   DOB - 11-02-2000  Admit Date - 12/04/2021  Outpatient Primary MD for the patient is Pcp, No   Assessment & Plan:   Assessment and Plan: - -CT angio of chest shows questionable mild nonocclusive pulmonary embolus but favored represent artifact.    1)FUO--- extensive but negative work-up so far please see HPI/subjective section -No abdominal pain no dyspnea -Denies tick bite denies recent travel, no history of STDs -Reports worsening sore throat/odynophagia -COVID, flu and mono negative -ESR is trending up  2) generalized weakness/anorexia/odynophagia/malaise/arthralgia----occasional fevers and chills  -- Favor inflammatory disorder over infectious disorder - malaise, fatigue, arthralgias, migraine, cough at times with hemoptysis, chest discomfort especially when he lays down, chills and fevers, unexplained night sweats, generalized weakness, and recently elevated LFTs -Patient was seen at [facility] hematology oncology in [place].... In June 2023 -He was subsequently evaluated by Dr. Michel Bickers of infectious disease at [facility] health in [place] due to concerns for elevated EBV titers. 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    Details: EKG shows sinus tachycardia with right atrial enlargement, borderline T wave abnormalities, borderline ST elevation inferior leads Discussion of management or test interpretation with external provider(s): Discussed findings with cardiology, Dr. Lovena Le.  Felt that effusion does not need drainage at this time, patient will need echocardiogram and recommends hospitalization for observation -Initially treated with IV heparin.... With negative VQ scan IV heparin has been discontinued -Check echocardiogram -Check lower extremity Dopplers  Disposition/Need for in-Hospital Stay- patient unable to be discharged at this time due to -atypical chest pain.Marland Kitchen  Possible pericardial effusion awaiting echocardiogram  Dispo: The patient is from: Home              Anticipated d/c is to: Home              Anticipated d/c date is: 1 day              Patient currently is not medically stable to d/c. Barriers: Not Clinically Stable-   With History of - Reviewed by me  History reviewed. No pertinent past medical history.    History reviewed. No pertinent surgical history.   Chief Complaint  Patient presents with   Chest Pain      HPI:    David [name]  is a 21 y.o. male with multiple complaints lasting  over 3 months now.... -Patient had malaise, fatigue, arthralgias, migraine, cough at times with hemoptysis, chest discomfort especially when he lays down, chills and fevers, unexplained night sweats, generalized weakness, and recently elevated LFTs -Patient was seen at [facility] hematology oncology in [place].... In June 2023 -He was subsequently evaluated by Dr. Michel Bickers of infectious disease at [facility] health in [place] due to concerns for elevated EBV titers. - In the ED today CT scan was inconclusive for PE,.... 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  Positive for moderate pericardial effusion, possible pericarditis ECG/medicine tests: ordered.    Details: EKG shows sinus tachycardia with right atrial enlargement, borderline T wave abnormalities, borderline ST elevation inferior leads Discussion of management or test interpretation with external provider(s): Discussed findings with cardiology, Dr. Lovena Le.  Felt that effusion does not need drainage at this time, patient will need echocardiogram and recommends hospitalization for observation      Review of systems:    In addition to the HPI above,   A full Review of  Systems was done, all other systems reviewed are negative except as noted above in HPI , .    Social History:  Reviewed by me    Social History   Tobacco Use   Smoking status: Never   Smokeless tobacco: Never  Substance Use Topics   Alcohol use: Not Currently     Family History :  Reviewed by me  -Grandmother with rheumatoid arthritis   Home Medications:   Prior to Admission medications   Medication Sig Start Date End Date Taking? Authorizing Provider  ibuprofen (ADVIL) 600 MG tablet Take 600 mg  by mouth every 6 (six) hours as needed. 08/17/21  Yes [provider]     Allergies:     Allergies  Allergen Reactions   Fish Allergy Itching     Physical Exam:   Vitals  Blood pressure 111/74, pulse 92, temperature 98.7 F (37.1 C), resp. rate 17, height 6' (1.829 m), weight 59 kg, SpO2 100 %.  Physical Examination: General appearance - alert,  in no distress , thin  Mental status - alert, oriented to person, place, and time,  Eyes - sclera anicteric Neck - supple, no JVD elevation , Chest - clear  to auscultation bilaterally, symmetrical air movement,  Heart - S1 and S2 normal, regular  Abdomen - soft, nontender, nondistended, +BS Neurological - screening mental status exam normal, neck supple without rigidity, cranial nerves II through XII intact, DTR's normal and symmetric Extremities - no pedal edema noted, intact peripheral pulses  Skin - warm, dry MSK--- arthralgias, patient has tenderness with palpation and range of motion of multiple joints, however no significant erythema, swelling, deformity or warmth over the joints     Data Review:    CBC Recent  Labs  Lab 12/04/21 0915  WBC 17.6*  HGB 12.1*  HCT 36.7*  PLT 188  MCV 86.2  MCH 28.4  MCHC 33.0  RDW 14.6  LYMPHSABS 3.5  MONOABS 1.3*  EOSABS 0.0  BASOSABS 0.0   ------------------------------------------------------------------------------------------------------------------  Chemistries  Recent Labs  Lab 12/04/21 0915  NA 130*  K 3.8  CL 97*  CO2 24  GLUCOSE 117*  BUN 8  CREATININE 0.54*  CALCIUM 8.3*  AST 69*  ALT 54*  ALKPHOS 67  BILITOT 0.8   ------------------------------------------------------------------------------------------------------------------ estimated creatinine clearance is 123.9 mL/min (A) (by C-G formula based on SCr of 0.54 mg/dL (L)). ------------------------------------------------------------------------------------------------------------------ Recent  Labs    12/04/21 0911  TSH 1.992     Coagulation profile Recent Labs  Lab 12/04/21 0915  INR 1.4*   ------------------------------------------------------------------------------------------------------------------- No results for input(s): "DDIMER" in the last 72 hours. -------------------------------------------------------------------------------------------------------------------  Cardiac Enzymes No results for input(s): "CKMB", "TROPONINI", "MYOGLOBIN" in the last 168 hours.  Invalid input(s): "CK" ------------------------------------------------------------------------------------------------------------------ No results found for: "BNP"   ---------------------------------------------------------------------------------------------------------------  Urinalysis    Component Value Date/Time   COLORURINE YELLOW 12/04/2021 [place] 12/04/2021 1053   LABSPEC 1.019 12/04/2021 1053   PHURINE 6.0 12/04/2021 1053   GLUCOSEU NEGATIVE 12/04/2021 1053   HGBUR NEGATIVE 12/04/2021 1053   BILIRUBINUR NEGATIVE 12/04/2021 1053   KETONESUR 80 (A) 12/04/2021 1053   PROTEINUR 100 (A) 12/04/2021 1053   NITRITE NEGATIVE 12/04/2021 1053   LEUKOCYTESUR NEGATIVE 12/04/2021 1053    ----------------------------------------------------------------------------------------------------------------   Imaging Results:    NM Pulmonary Perf and Vent  Result Date: 12/04/2021 CLINICAL DATA:  Chronic dyspnea EXAM: NUCLEAR MEDICINE PERFUSION LUNG SCAN TECHNIQUE: Perfusion images were obtained in multiple projections after intravenous injection of radiopharmaceutical. Ventilation scans intentionally deferred if perfusion scan and chest x-ray adequate for interpretation during COVID 19 epidemic. RADIOPHARMACEUTICALS:  4.4 mCi Tc-58mMAA IV COMPARISON:  Chest radiographs and CT chest done earlier today FINDINGS: There are no discrete wedge-shaped segmental or subsegmental perfusion  defects. There is subtle diffuse decreased activity in the left lower lung field in the LPO projection, possibly related to the cardiac attenuation. Without ventilation images, evaluation of this finding is less than optimal. IMPRESSION: There are no discrete wedge-shaped perfusion defects. Imaging findings suggest low probability for pulmonary embolism. Electronically Signed   By: PElmer PickerM.D.   On: 12/04/2021 17:57   CT Angio Chest PE W and/or Wo Contrast  Addendum Date: 12/04/2021   ADDENDUM REPORT: 12/04/2021 12:40 ADDENDUM: Study discussed by telephone with Dr. JGara Kroneron 12/04/2021 at 1159 hours. Electronically Signed   By: HGenevie AnnM.D.   On: 12/04/2021 12:40   Result Date: 12/04/2021 CLINICAL DATA:  21year old male with left side chest pain, joint pain. EXAM: CT ANGIOGRAPHY CHEST WITH CONTRAST TECHNIQUE: Multidetector CT imaging of the chest was performed using the standard protocol during bolus administration of intravenous contrast. Multiplanar CT image reconstructions and MIPs were obtained to evaluate the vascular anatomy. RADIATION DOSE REDUCTION: This exam was performed according to the departmental dose-optimization program which includes automated exposure control, adjustment of the mA and/or kV according to patient size and/or use of iterative reconstruction technique. CONTRAST:  731mOMNIPAQUE IOHEXOL 350 MG/ML SOLN COMPARISON:  Portable chest 0931 hours today. FINDINGS: Cardiovascular: Good contrast bolus timing in the pulmonary arterial tree. Respiratory motion. Difficult to exclude nonocclusive thrombus in a segmental branch of the left lower lobe on series 5, image 137. But a nearby bifurcation and the immediate downstream branches  appear patent. And no other pulmonary artery filling defect is identified. Moderate circumferential pericardial effusion, up to 15 mm in thickness. Pericardial fluid measures low to intermediate density. Negative visible aorta. Mediastinum/Nodes:  Reactive appearing small mediastinal and axillary lymph nodes, individually 10-12 mm short axis. Lungs/Pleura: Trace layering pleural fluid bilaterally. Mild dependent atelectasis, most pronounced in the left costophrenic angle. Major airways are patent. No pulmonary contusion or consolidation to suggest infarct or pneumonia. Upper Abdomen: Negative visible early contrast enhanced liver, spleen, pancreas, adrenal glands and kidneys. Negative visible bowel in the upper abdomen. Musculoskeletal: Mild to moderate dextroconvex scoliosis. Otherwise negative. Review of the MIP images confirms the above findings. IMPRESSION: 1. Respiratory motion with a questionable small solitary nonocclusive pulmonary embolus in a segmental branch of the left lower lobe. But favor this is artifact. 2. Positive for up to Moderate Pericardial Effusion. Consider Pericarditis. 3. Reactive appearing axillary and mediastinal lymph nodes, and trace bilateral pleural fluid. Dependent opacity most pronounced in the left costophrenic angle. Favor atelectasis rather than pulmonary infarct or inflammation. Electronically Signed: By: Genevie Ann M.D. On: 12/04/2021 11:58   DG Chest Port 1 View  Result Date: 12/04/2021 CLINICAL DATA:  Questionable sepsis.  Chest pain. EXAM: PORTABLE CHEST 1 VIEW COMPARISON:  None Available. FINDINGS: The heart size and mediastinal contours are within normal limits. Both lungs are clear. The visualized skeletal structures are unremarkable. IMPRESSION: No active disease. Electronically Signed   By: Dorise Bullion III M.D.   On: 12/04/2021 09:38    Radiological Exams on Admission: NM Pulmonary Perf and Vent  Result Date: 12/04/2021 CLINICAL DATA:  Chronic dyspnea EXAM: NUCLEAR MEDICINE PERFUSION LUNG SCAN TECHNIQUE: Perfusion images were obtained in multiple projections after intravenous injection of radiopharmaceutical. Ventilation scans intentionally deferred if perfusion scan and chest x-ray adequate for  interpretation during COVID 19 epidemic. RADIOPHARMACEUTICALS:  4.4 mCi Tc-78mMAA IV COMPARISON:  Chest radiographs and CT chest done earlier today FINDINGS: There are no discrete wedge-shaped segmental or subsegmental perfusion defects. There is subtle diffuse decreased activity in the left lower lung field in the LPO projection, possibly related to the cardiac attenuation. Without ventilation images, evaluation of this finding is less than optimal. IMPRESSION: There are no discrete wedge-shaped perfusion defects. Imaging findings suggest low probability for pulmonary embolism. Electronically Signed   By: PElmer PickerM.D.   On: 12/04/2021 17:57   CT Angio Chest PE W and/or Wo Contrast  Addendum Date: 12/04/2021   ADDENDUM REPORT: 12/04/2021 12:40 ADDENDUM: Study discussed by telephone with Dr. JGara Kroneron 12/04/2021 at 1159 hours. Electronically Signed   By: HGenevie AnnM.D.   On: 12/04/2021 12:40   Result Date: 12/04/2021 CLINICAL DATA:  21year old male with left side chest pain, joint pain. EXAM: CT ANGIOGRAPHY CHEST WITH CONTRAST TECHNIQUE: Multidetector CT imaging of the chest was performed using the standard protocol during bolus administration of intravenous contrast. Multiplanar CT image reconstructions and MIPs were obtained to evaluate the vascular anatomy. RADIATION DOSE REDUCTION: This exam was performed according to the departmental dose-optimization program which includes automated exposure control, adjustment of the mA and/or kV according to patient size and/or use of iterative reconstruction technique. CONTRAST:  78mOMNIPAQUE IOHEXOL 350 MG/ML SOLN COMPARISON:  Portable chest 0931 hours today. FINDINGS: Cardiovascular: Good contrast bolus timing in the pulmonary arterial tree. Respiratory motion. Difficult to exclude nonocclusive thrombus in a segmental branch of the left lower lobe on series 5, image 137. But a nearby bifurcation and the immediate downstream branches  appear patent. And  no other pulmonary artery filling defect is identified. Moderate circumferential pericardial effusion, up to 15 mm in thickness. Pericardial fluid measures low to intermediate density. Negative visible aorta. Mediastinum/Nodes: Reactive appearing small mediastinal and axillary lymph nodes, individually 10-12 mm short axis. Lungs/Pleura: Trace layering pleural fluid bilaterally. Mild dependent atelectasis, most pronounced in the left costophrenic angle. Major airways are patent. No pulmonary contusion or consolidation to suggest infarct or pneumonia. Upper Abdomen: Negative visible early contrast enhanced liver, spleen, pancreas, adrenal glands and kidneys. Negative visible bowel in the upper abdomen. Musculoskeletal: Mild to moderate dextroconvex scoliosis. Otherwise negative. Review of the MIP images confirms the above findings. IMPRESSION: 1. Respiratory motion with a questionable small solitary nonocclusive pulmonary embolus in a segmental branch of the left lower lobe. But favor this is artifact. 2. Positive for up to Moderate Pericardial Effusion. Consider Pericarditis. 3. Reactive appearing axillary and mediastinal lymph nodes, and trace bilateral pleural fluid. Dependent opacity most pronounced in the left costophrenic angle. Favor atelectasis rather than pulmonary infarct or inflammation. Electronically Signed: By: Genevie Ann M.D. On: 12/04/2021 11:58   DG Chest Port 1 View  Result Date: 12/04/2021 CLINICAL DATA:  Questionable sepsis.  Chest pain. EXAM: PORTABLE CHEST 1 VIEW COMPARISON:  None Available. FINDINGS: The heart size and mediastinal contours are within normal limits. Both lungs are clear. The visualized skeletal structures are unremarkable. IMPRESSION: No active disease. Electronically Signed   By: Dorise Bullion III M.D.   On: 12/04/2021 09:38    DVT Prophylaxis -SCD/Lovenox AM Labs Ordered, also please review Full Orders  Family Communication: Admission, patients condition and plan of  care including tests being ordered have been discussed with the patient and mother who indicate understanding and agree with the plan   Condition  -stable  Roxan Hockey M.D on 12/04/2021 at 6:28 PM Go to [email or web address] -  for contact info  [facility] - Office  [phone]

## 2021-12-04 NOTE — Progress Notes (Signed)
ANTICOAGULATION CONSULT NOTE - Initial Consult  Pharmacy Consult for heparin gtt  Indication: pulmonary embolus  No Known Allergies  Patient Measurements: Height: 6' (182.9 cm) Weight: 59 kg (130 lb 1.1 oz) IBW/kg (Calculated) : 77.6 Heparin Dosing Weight: HEPARIN DW (KG): 59   Vital Signs: Temp: 98.6 F (37 C) (08/12 1111) Temp Source: Oral (08/12 1111) BP: 104/60 (08/12 1130) Pulse Rate: 115 (08/12 1130)  Labs: Recent Labs    12/04/21 0915  HGB 12.1*  HCT 36.7*  PLT 188  APTT 35  LABPROT 16.9*  INR 1.4*  CREATININE 0.54*    Estimated Creatinine Clearance: 123.9 mL/min (A) (by C-G formula based on SCr of 0.54 mg/dL (L)).   Medical History: History reviewed. No pertinent past medical history.  Medications:  (Not in a hospital admission)  Scheduled:   heparin  4,000 Units Intravenous Once   Infusions:   heparin     lactated ringers 150 mL/hr at 12/04/21 0937   PRN:  Anti-infectives (From admission, onward)    None       Assessment: David [name] a 21 y.o. male requires anticoagulation with a heparin iv infusion for the indication of  pulmonary embolus. Heparin gtt will be started following pharmacy protocol per pharmacy consult. Patient is not on previous oral anticoagulant that will require aPTT/HL correlation before transitioning to only HL monitoring.   Goal of Therapy:  Heparin level 0.3-0.7 units/ml Monitor platelets by anticoagulation protocol: Yes   Plan:  Give 4000 units bolus x 1 Start heparin infusion at 950 units/hr Check anti-Xa level in 6 hours and daily while on heparin Continue to monitor H&H and platelets   David [name] 12/04/2021,12:41 PM

## 2021-12-04 NOTE — ED Triage Notes (Signed)
Pt to the ED with complaints of left side non-radiating chest pain that began tow days ago. Pt is also complaining of joint pain and the mother would like to have him tested for sickle cell.

## 2021-12-04 NOTE — Progress Notes (Signed)
ANTICOAGULATION CONSULT NOTE - Consult  Pharmacy Consult for heparin gtt  Indication: ACS per Dr Marisa Severin phone call to pharmacist   No Known Allergies  Patient Measurements: Height: 6' (182.9 cm) Weight: 59 kg (130 lb 1.1 oz) IBW/kg (Calculated) : 77.6 Heparin Dosing Weight: HEPARIN DW (KG): 59   Vital Signs: Temp: 98.6 F (37 C) (08/12 1111) Temp Source: Oral (08/12 1111) BP: 134/75 (08/12 1300) Pulse Rate: 104 (08/12 1300)  Labs: Recent Labs    12/04/21 0915  HGB 12.1*  HCT 36.7*  PLT 188  APTT 35  LABPROT 16.9*  INR 1.4*  CREATININE 0.54*     Estimated Creatinine Clearance: 123.9 mL/min (A) (by C-G formula based on SCr of 0.54 mg/dL (L)).   Medical History: History reviewed. No pertinent past medical history.  Medications:  (Not in a hospital admission) Scheduled:    Infusions:   heparin     PRN:  Anti-infectives (From admission, onward)    None       Assessment: David [name] a 21 y.o. male requires anticoagulation with a heparin iv infusion for the indication of  pulmonary embolus. Heparin gtt will be started following pharmacy protocol per pharmacy consult. Patient is not on previous oral anticoagulant that will require aPTT/HL correlation before transitioning to only HL monitoring.   Goal of Therapy:  Heparin level 0.3-0.7 units/ml Monitor platelets by anticoagulation protocol: Yes   Plan:  Change heparin infusion to 650 units/hr due to MD changing indication from PE to ACS  Check anti-Xa level in 6 hours and daily while on heparin Continue to monitor H&H and platelets   Gerre Pebbles Armonte Tortorella 12/04/2021,1:31 PM

## 2021-12-05 ENCOUNTER — Observation Stay (HOSPITAL_BASED_OUTPATIENT_CLINIC_OR_DEPARTMENT_OTHER): Payer: Self-pay

## 2021-12-05 ENCOUNTER — Observation Stay (HOSPITAL_COMMUNITY): Payer: Self-pay

## 2021-12-05 DIAGNOSIS — M25542 Pain in joints of left hand: Secondary | ICD-10-CM

## 2021-12-05 DIAGNOSIS — R634 Abnormal weight loss: Secondary | ICD-10-CM

## 2021-12-05 DIAGNOSIS — M25541 Pain in joints of right hand: Secondary | ICD-10-CM

## 2021-12-05 DIAGNOSIS — R9431 Abnormal electrocardiogram [ECG] [EKG]: Secondary | ICD-10-CM

## 2021-12-05 LAB — COMPREHENSIVE METABOLIC PANEL
ALT: 48 U/L — ABNORMAL HIGH (ref 0–44)
AST: 56 U/L — ABNORMAL HIGH (ref 15–41)
Albumin: 2.8 g/dL — ABNORMAL LOW (ref 3.5–5.0)
Alkaline Phosphatase: 61 U/L (ref 38–126)
Anion gap: 3 — ABNORMAL LOW (ref 5–15)
BUN: 10 mg/dL (ref 6–20)
CO2: 29 mmol/L (ref 22–32)
Calcium: 8.4 mg/dL — ABNORMAL LOW (ref 8.9–10.3)
Chloride: 106 mmol/L (ref 98–111)
Creatinine, Ser: 0.5 mg/dL — ABNORMAL LOW (ref 0.61–1.24)
GFR, Estimated: >60 mL/min (ref >60)
Glucose, Bld: 92 mg/dL (ref 70–99)
Potassium: 4.4 mmol/L (ref 3.5–5.1)
Sodium: 138 mmol/L (ref 135–145)
Total Bilirubin: 0.7 mg/dL (ref 0.3–1.2)
Total Protein: 8.2 g/dL — ABNORMAL HIGH (ref 6.5–8.1)

## 2021-12-05 LAB — CBC
HCT: 34.8 % — ABNORMAL LOW (ref 39.0–52.0)
Hemoglobin: 11 g/dL — ABNORMAL LOW (ref 13.0–17.0)
MCH: 27.7 pg (ref 26.0–34.0)
MCHC: 31.6 g/dL (ref 30.0–36.0)
MCV: 87.7 fL (ref 80.0–100.0)
Platelets: 170 10*3/uL (ref 150–400)
RBC: 3.97 MIL/uL — ABNORMAL LOW (ref 4.22–5.81)
RDW: 14.5 % (ref 11.5–15.5)
WBC: 9.4 10*3/uL (ref 4.0–10.5)
nRBC: 0 % (ref 0.0–0.2)

## 2021-12-05 LAB — ECHOCARDIOGRAM COMPLETE
Area-P 1/2: 5.97 cm2
Calc EF: 65.5 %
Height: 72 in
S' Lateral: 2.9 cm
Single Plane A2C EF: 62.3 %
Single Plane A4C EF: 70.7 %
Weight: 2081.14 oz

## 2021-12-05 MED ORDER — IBUPROFEN 600 MG PO TABS: 600.0000 mg | ORAL_TABLET | Freq: Four times a day (QID) | ORAL | 0 refills | Status: DC | PRN

## 2021-12-05 MED ORDER — MIRTAZAPINE 15 MG PO TABS: 15.0000 mg | ORAL_TABLET | Freq: Every day | ORAL | 5 refills | Status: DC

## 2021-12-05 NOTE — Progress Notes (Signed)
   12/04/21 2144  Assess: if the MEWS score is Yellow or Red  Were vital signs taken at a resting state? Yes  Focused Assessment No change from prior assessment  Does the patient meet 2 or more of the SIRS criteria? No  MEWS guidelines implemented *See Row Information* Yes  Treat  MEWS Interventions Administered scheduled meds/treatments  Pain Scale 0-10  Pain Score 3  Pain Type Acute pain  Pain Location Chest  Pain Orientation Left  Pain Descriptors / Indicators Aching  Pain Frequency Constant  Pain Onset On-going  Patients Stated Pain Goal 0  Multiple Pain Sites No  Take Vital Signs  Increase Vital Sign Frequency  Yellow: Q 2hr X 2 then Q 4hr X 2, if remains yellow, continue Q 4hrs  Escalate  MEWS: Escalate Yellow: discuss with charge nurse/RN and consider discussing with provider and RRT  Notify: Charge Nurse/RN  Name of Charge Nurse/RN Notified Tiffany, RN  Date Charge Nurse/RN Notified 12/04/21  Time Charge Nurse/RN Notified 2146  Document  Patient Outcome Stabilized after interventions  Progress note created (see row info) Yes

## 2021-12-05 NOTE — Progress Notes (Signed)
  Echocardiogram 2D Echocardiogram has been performed.  David [name] 12/05/2021, 1:51 PM

## 2021-12-05 NOTE — Plan of Care (Signed)

## 2021-12-05 NOTE — Discharge Instructions (Signed)
1)Please follow up with [facility] Rheumatology.... for further investigations regarding possible inflammatory disorder.... and arthralgias  2)Please call and schedule follow up with Dr Cliffton Asters (Infectious Disease)... at [facility] in [place] as soon as possible

## 2021-12-05 NOTE — Social Work (Signed)
CSW spoke with provider about the Pt, The Pt needs a PCP, Provider wants TOC to assist this Pt with some help getting his medicaid back as the provider stated that the Pt is very sick. TOC will add resources to Pt chart, also will have TOC follow up for a PCP appointment.

## 2021-12-05 NOTE — Discharge Summary (Signed)
David [name], is a 21 y.o. male  DOB 2000-05-22  MRN [id number].  Admission date:  12/04/2021  Admitting Physician  Roxan Hockey, MD  Discharge Date:  12/05/2021   Primary MD  Pcp, No  Recommendations for primary care physician for things to follow:   1)Please follow up with [facility] Rheumatology.... for further investigations regarding possible inflammatory disorder.... and arthralgias  2)Please call and schedule follow up with Dr Michel Bickers (Infectious Disease)... at [facility] in [place] as soon as possible  Admission Diagnosis  Atypical chest pain [R07.89] Viral illness [B34.9] Chest pain [R07.9]   Discharge Diagnosis  Atypical chest pain [R07.89] Viral illness [B34.9] Chest pain [R07.9]    Principal Problem:   Chest pain Active Problems:   Unintentional weight loss   Elevated liver enzymes   Arthralgia   FUO (fever of unknown origin)      History reviewed. No pertinent past medical history.  History reviewed. No pertinent surgical history.     HPI  from the history and physical done on the day of admission:     David [name]  is a 21 y.o. male with multiple complaints lasting over 3 months now.... -Patient had malaise, fatigue, arthralgias, migraine, cough at times with hemoptysis, chest discomfort especially when he lays down, chills and fevers, unexplained night sweats, generalized weakness, and recently elevated LFTs -Patient was seen at [facility] hematology oncology in [place].... In June 2023 -He was subsequently evaluated by Dr. Michel Bickers of infectious disease at [facility] health in [place] due to concerns for elevated EBV titers. - In the ED today CT scan was inconclusive for PE,.... VQ scan low possibility for PE   -CT abdomen and pelvis from 09/08/2021 without acute findings patient had a small hiatal hernia -Complete x-ray bone survey from September 24, 2021 without evidence of  metastasis or malignancy -Recent LFTs and LDH was somewhat elevated -Patient had work-up for monoclonal gammopathy -Patient also had leukemia/lymphoma flow cytometry -ESR is trending up -Patient had abnormal ANA -HIV was negative -Liver biopsy from 07/06/2021 was very unremarkable, specifically no evidence of autoimmune hepatitis -Chest symptoms apparently worsened around 12/02/2021   No Nausea, Vomiting or Diarrhea -No abdominal pain no dyspnea -Denies tick bite denies recent travel, no history of STDs -Reports worsening sore throat/odynophagia -COVID, flu and mono negative -CT angio of chest shows questionable mild nonocclusive pulmonary embolus but favored represent artifact.  Positive for moderate pericardial effusion, possible pericarditis ECG/medicine tests: ordered.    Details: EKG shows sinus tachycardia with right atrial enlargement, borderline T wave abnormalities, borderline ST elevation inferior leads Discussion of management or test interpretation with external provider(s): Discussed findings with cardiology, Dr. Lovena Le.  Felt that effusion does not need drainage at this time, patient will need echocardiogram and recommends hospitalization for observation   Hospital Course:   Assessment and Plan:   1)FUO--- extensive but negative work-up so far please see HPI/subjective section of H and P dated 12/04/21 -No abdominal pain , No dyspnea -Denies tick bite denies recent travel, no  history of STDs -Reports sore throat/odynophagia--strep and mononucleosis Negative -COVID, flu negative -ESR is up to 92 ESR-was 67 on 09/01/21 ESR-was 39 on 09/24/21 CRP- 23.6, PCT 0.13 -In summary--patient has had symptoms since September 2022 worse over the last few months with more than 30 pound weight loss over the last 6 months...  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 Michel Bickers (ID) on 10/21/21 reviewed and noted   2)Generalized weakness/anorexia/odynophagia/malaise/arthralgia----occasional fevers and chills  -- Favor inflammatory disorder over infectious disorder - malaise, fatigue, arthralgias, migraine, cough at times with hemoptysis, chest discomfort especially when he lays down, chills and fevers, unexplained night sweats, generalized weakness, and recently elevated LFTs -Patient was seen at [facility] hematology oncology in [place].... In June 2023 -He was subsequently evaluated by Dr. Michel Bickers of infectious disease at [facility] health in [place] due to concerns for elevated EBV titers. 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    Details: EKG shows sinus tachycardia with right atrial enlargement, borderline T wave abnormalities, borderline ST elevation inferior leads Discussion of management or test interpretation with external provider(s): Discussed findings with cardiology, Dr. Lovena Le.  Felt that effusion does not need drainage at this time, patient will need echocardiogram and recommends hospitalization for observation -Initially treated with IV heparin.... With negative VQ scan IV heparin has been discontinued -Echocardiogram-Normal biventricular function without  evidence of hemodynamically significant valvular heart disease, no significant pericardial effusion... EF 65 to 70 %  -- lower extremity Dopplers w/o DVT  4)Elevated LFTs----- Liver Biopsy from 07/06/2021 was very unremarkable, specifically no evidence of autoimmune hepatitis  5)-Social/Ethics-----Pt needs health insurance... his medicaid expired...  -In summary--patient has had symptoms since September 2022 worse over the last few months with more than 30 pound weight loss over the last 6 months...  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 Call.   Specialty: Infectious Diseases Contact information: [address] [place] [address] [phone]                 Diet and Activity recommendation:  As advised  Discharge Instructions    Discharge Instructions     Call MD for:  difficulty breathing, headache or visual disturbances   Complete by: As directed    Call MD for:  persistant dizziness or light-headedness   Complete by: As directed    Call MD for:  persistant nausea and vomiting   Complete by: As directed    Call MD for:  severe uncontrolled pain   Complete by: As directed    Call MD for:  temperature >100.4   Complete by: As directed    Diet general   Complete by: As directed    Discharge instructions   Complete by: As directed    1)Please follow up with [facility] Rheumatology.... for further investigations regarding possible inflammatory disorder.... and arthralgias  2)Please call and schedule follow up with Dr Michel Bickers (Infectious Disease)... at [facility] in [place] as soon as possible   Increase activity  slowly   Complete by: As directed         Discharge Medications     Allergies as of 12/05/2021       Reactions   Fish Allergy Itching        Medication List     TAKE these medications    ibuprofen 600 MG tablet Commonly known as: ADVIL Take 1 tablet (600 mg total) by mouth every 6 (six) hours as needed for headache or mild pain. Always take with Food What changed:  reasons to take this additional instructions   mirtazapine 15 MG tablet Commonly known as: REMERON Take 1 tablet (15 mg total) by mouth at bedtime.       Major procedures and Radiology Reports - PLEASE review detailed and final reports for all details, in brief -   ECHOCARDIOGRAM COMPLETE  Result Date: 12/05/2021    ECHOCARDIOGRAM REPORT   Patient Name:   David [name] Date of Exam: 12/05/2021 Medical Rec #:  [id number]   Height:       72.0 in Accession #:    [id number]  Weight:       130.1 lb Date of Birth:  07-11-2000  BSA:          1.775 m Patient Age:    20 years    BP:           99/69 mmHg Patient Gender: M           HR:           90 bpm. Exam Location:  [facility] Procedure: 2D Echo, Cardiac Doppler and Color Doppler Indications:    R94.31 Abnormal EKG  History:        Patient has no prior history of Echocardiogram examinations.  Sonographer:    Bernadene Person RDCS Referring Phys: [place]  1. Left ventricular ejection fraction, by estimation, is 65 to 70%. The left ventricle has normal function. The left ventricle has no regional wall motion abnormalities. There is mild concentric left ventricular hypertrophy. Left ventricular diastolic parameters were normal.  2. Right ventricular systolic function is normal. The right ventricular size is normal. There is normal pulmonary artery systolic pressure.  3. There is no evidence of cardiac tamponade.  4. The mitral valve is normal in structure. No evidence of mitral valve regurgitation. No evidence of mitral stenosis.  5. The aortic valve is  tricuspid. Aortic valve regurgitation is not visualized. No aortic stenosis is present.  6. The inferior vena cava is normal in size with greater than 50% respiratory variability, suggesting right atrial pressure of 3 mmHg. Comparison(s): No prior Echocardiogram. Conclusion(s)/Recommendation(s): Normal biventricular function without evidence of hemodynamically significant valvular heart disease. FINDINGS  Left Ventricle: Left ventricular ejection fraction, by estimation, is 65 to 70%. The left ventricle has normal function. The left ventricle has no regional wall motion abnormalities. The left ventricular internal cavity size was normal in size. There is  mild concentric left ventricular  hypertrophy. Left ventricular diastolic parameters were normal. Right Ventricle: The right ventricular size is normal. No increase in right ventricular wall thickness. Right ventricular systolic function is normal. There is normal pulmonary artery systolic pressure. The tricuspid regurgitant velocity is 1.71 m/s, and  with an assumed right atrial pressure of 3 mmHg, the estimated right ventricular systolic pressure is 01.0 mmHg. Left Atrium: Left atrial size was normal in size. Right Atrium: Right atrial size was normal in size. Pericardium: Trivial pericardial effusion is present. The pericardial effusion is circumferential. There is no evidence of cardiac tamponade. Mitral Valve: The mitral valve is normal in structure. No evidence of mitral valve regurgitation. No evidence of mitral valve stenosis. Tricuspid Valve: The tricuspid valve is normal in structure. Tricuspid valve regurgitation is mild . No evidence of tricuspid stenosis. Aortic Valve: The aortic valve is tricuspid. Aortic valve regurgitation is not visualized. No aortic stenosis is present. Pulmonic Valve: The pulmonic valve was normal in structure. Pulmonic valve regurgitation is not visualized. No evidence of pulmonic stenosis. Aorta: The aortic root, ascending aorta  and aortic arch are all structurally normal, with no evidence of dilitation or obstruction. Venous: The inferior vena cava is normal in size with greater than 50% respiratory variability, suggesting right atrial pressure of 3 mmHg. IAS/Shunts: No atrial level shunt detected by color flow Doppler.  LEFT VENTRICLE PLAX 2D LVIDd:         4.10 cm     Diastology LVIDs:         2.90 cm     LV e' medial:    10.40 cm/s LV PW:         1.10 cm     LV E/e' medial:  7.3 LV IVS:        1.20 cm     LV e' lateral:   11.60 cm/s LVOT diam:     2.20 cm     LV E/e' lateral: 6.6 LV SV:         54 LV SV Index:   30 LVOT Area:     3.80 cm  LV Volumes (MOD) LV vol d, MOD A2C: 78.3 ml LV vol d, MOD A4C: 76.5 ml LV vol s, MOD A2C: 29.5 ml LV vol s, MOD A4C: 22.4 ml LV SV MOD A2C:     48.8 ml LV SV MOD A4C:     76.5 ml LV SV MOD BP:      52.3 ml RIGHT VENTRICLE RV S prime:     9.11 cm/s TAPSE (M-mode): 1.6 cm LEFT ATRIUM             Index        RIGHT ATRIUM           Index LA diam:        2.70 cm 1.52 cm/m   RA Area:     14.10 cm LA Vol (A2C):   38.1 ml 21.47 ml/m  RA Volume:   36.20 ml  20.40 ml/m LA Vol (A4C):   34.3 ml 19.33 ml/m LA Biplane Vol: 39.3 ml 22.15 ml/m  AORTIC VALVE LVOT Vmax:   79.70 cm/s LVOT Vmean:  53.600 cm/s LVOT VTI:    0.141 m  AORTA Ao Root diam: 2.80 cm Ao Asc diam:  2.70 cm MITRAL VALVE               TRICUSPID VALVE MV Area (PHT): 5.97 cm    TR Peak grad:   11.7 mmHg MV Decel Time: 127  msec    TR Vmax:        171.00 cm/s MV E velocity: 76.30 cm/s MV A velocity: 44.10 cm/s  SHUNTS MV E/A ratio:  1.73        Systemic VTI:  0.14 m                            Systemic Diam: 2.20 cm Rudean Haskell MD Electronically signed by Rudean Haskell MD Signature Date/Time: 12/05/2021/2:50:47 PM    Final    US Venous Img Lower Bilateral (DVT)  Result Date: 12/05/2021 CLINICAL DATA:  Chronic dyspnea EXAM: BILATERAL LOWER EXTREMITY VENOUS DOPPLER ULTRASOUND TECHNIQUE: Gray-scale sonography with graded  compression, as well as color Doppler and duplex ultrasound were performed to evaluate the lower extremity deep venous systems from the level of the common femoral vein and including the common femoral, femoral, profunda femoral, popliteal and calf veins including the posterior tibial, peroneal and gastrocnemius veins when visible. The superficial great saphenous vein was also interrogated. Spectral Doppler was utilized to evaluate flow at rest and with distal augmentation maneuvers in the common femoral, femoral and popliteal veins. COMPARISON:  None Available. FINDINGS: RIGHT LOWER EXTREMITY Common Femoral Vein: No evidence of thrombus. Normal compressibility, respiratory phasicity and response to augmentation. Saphenofemoral Junction: No evidence of thrombus. Normal compressibility and flow on color Doppler imaging. Profunda Femoral Vein: No evidence of thrombus. Normal compressibility and flow on color Doppler imaging. Femoral Vein: No evidence of thrombus. Normal compressibility, respiratory phasicity and response to augmentation. Popliteal Vein: No evidence of thrombus. Normal compressibility, respiratory phasicity and response to augmentation. Calf Veins: No evidence of thrombus. Normal compressibility and flow on color Doppler imaging. Superficial Great Saphenous Vein: No evidence of thrombus. Normal compressibility. Venous Reflux:  None. Other Findings:  None. LEFT LOWER EXTREMITY Common Femoral Vein: No evidence of thrombus. Normal compressibility, respiratory phasicity and response to augmentation. Saphenofemoral Junction: No evidence of thrombus. Normal compressibility and flow on color Doppler imaging. Profunda Femoral Vein: No evidence of thrombus. Normal compressibility and flow on color Doppler imaging. Femoral Vein: No evidence of thrombus. Normal compressibility, respiratory phasicity and response to augmentation. Popliteal Vein: No evidence of thrombus. Normal compressibility, respiratory  phasicity and response to augmentation. Calf Veins: No evidence of thrombus. Normal compressibility and flow on color Doppler imaging. Superficial Great Saphenous Vein: No evidence of thrombus. Normal compressibility. Venous Reflux:  None. Other Findings:  None. IMPRESSION: No evidence of deep venous thrombosis in either lower extremity. Electronically Signed   By: Davina Poke D.O.   On: 12/05/2021 09:52   NM Pulmonary Perf and Vent  Result Date: 12/04/2021 CLINICAL DATA:  Chronic dyspnea EXAM: NUCLEAR MEDICINE PERFUSION LUNG SCAN TECHNIQUE: Perfusion images were obtained in multiple projections after intravenous injection of radiopharmaceutical. Ventilation scans intentionally deferred if perfusion scan and chest x-ray adequate for interpretation during COVID 19 epidemic. RADIOPHARMACEUTICALS:  4.4 mCi Tc-28mMAA IV COMPARISON:  Chest radiographs and CT chest done earlier today FINDINGS: There are no discrete wedge-shaped segmental or subsegmental perfusion defects. There is subtle diffuse decreased activity in the left lower lung field in the LPO projection, possibly related to the cardiac attenuation. Without ventilation images, evaluation of this finding is less than optimal. IMPRESSION: There are no discrete wedge-shaped perfusion defects. Imaging findings suggest low probability for pulmonary embolism. Electronically Signed   By: PElmer PickerM.D.   On: 12/04/2021 17:57   CT Angio Chest PE W and/or Wo Contrast  Addendum Date: 12/04/2021   ADDENDUM REPORT: 12/04/2021 12:40 ADDENDUM: Study discussed by telephone with Dr. Gara Kroner on 12/04/2021 at 1159 hours. Electronically Signed   By: Genevie Ann M.D.   On: 12/04/2021 12:40   Result Date: 12/04/2021 CLINICAL DATA:  21 year old male with left side chest pain, joint pain. EXAM: CT ANGIOGRAPHY CHEST WITH CONTRAST TECHNIQUE: Multidetector CT imaging of the chest was performed using the standard protocol during bolus administration of intravenous  contrast. Multiplanar CT image reconstructions and MIPs were obtained to evaluate the vascular anatomy. RADIATION DOSE REDUCTION: This exam was performed according to the departmental dose-optimization program which includes automated exposure control, adjustment of the mA and/or kV according to patient size and/or use of iterative reconstruction technique. CONTRAST:  61m OMNIPAQUE IOHEXOL 350 MG/ML SOLN COMPARISON:  Portable chest 0931 hours today. FINDINGS: Cardiovascular: Good contrast bolus timing in the pulmonary arterial tree. Respiratory motion. Difficult to exclude nonocclusive thrombus in a segmental branch of the left lower lobe on series 5, image 137. But a nearby bifurcation and the immediate downstream branches appear patent. And no other pulmonary artery filling defect is identified. Moderate circumferential pericardial effusion, up to 15 mm in thickness. Pericardial fluid measures low to intermediate density. Negative visible aorta. Mediastinum/Nodes: Reactive appearing small mediastinal and axillary lymph nodes, individually 10-12 mm short axis. Lungs/Pleura: Trace layering pleural fluid bilaterally. Mild dependent atelectasis, most pronounced in the left costophrenic angle. Major airways are patent. No pulmonary contusion or consolidation to suggest infarct or pneumonia. Upper Abdomen: Negative visible early contrast enhanced liver, spleen, pancreas, adrenal glands and kidneys. Negative visible bowel in the upper abdomen. Musculoskeletal: Mild to moderate dextroconvex scoliosis. Otherwise negative. Review of the MIP images confirms the above findings. IMPRESSION: 1. Respiratory motion with a questionable small solitary nonocclusive pulmonary embolus in a segmental branch of the left lower lobe. But favor this is artifact. 2. Positive for up to Moderate Pericardial Effusion. Consider Pericarditis. 3. Reactive appearing axillary and mediastinal lymph nodes, and trace bilateral pleural fluid.  Dependent opacity most pronounced in the left costophrenic angle. Favor atelectasis rather than pulmonary infarct or inflammation. Electronically Signed: By: HGenevie AnnM.D. On: 12/04/2021 11:58   DG Chest Port 1 View  Result Date: 12/04/2021 CLINICAL DATA:  Questionable sepsis.  Chest pain. EXAM: PORTABLE CHEST 1 VIEW COMPARISON:  None Available. FINDINGS: The heart size and mediastinal contours are within normal limits. Both lungs are clear. The visualized skeletal structures are unremarkable. IMPRESSION: No active disease. Electronically Signed   By: DDorise BullionIII M.D.   On: 12/04/2021 09:38    Micro Results   Recent Results (from the past 240 hour(s))  Blood Culture (routine x 2)     Status: None (Preliminary result)   Collection Time: 12/04/21  9:15 AM   Specimen: Right Antecubital; Blood  Result Value Ref Range Status   Specimen Description   Final    RIGHT ANTECUBITAL BOTTLES DRAWN AEROBIC AND ANAEROBIC   Special Requests Blood Culture adequate volume  Final   Culture   Final    NO GROWTH < 24 HOURS [facility] [address] [place] [address]   Report Status PENDING  Incomplete  Resp Panel by RT-PCR (Flu A&B, Covid) Anterior Nasal Swab     Status: None   Collection Time: 12/04/21  9:18 AM   Specimen: Anterior Nasal Swab  Result Value Ref Range Status   SARS Coronavirus 2 by RT PCR NEGATIVE NEGATIVE Final  Comment: (NOTE) SARS-CoV-2 target nucleic acids are NOT DETECTED.  The SARS-CoV-2 RNA is generally detectable in upper respiratory specimens during the acute phase of infection. The lowest concentration of SARS-CoV-2 viral copies this assay can detect is 138 copies/mL. A negative result does not preclude SARS-Cov-2 infection and should not be used as the sole basis for treatment or other patient management decisions. A negative result may occur with  improper specimen collection/handling, submission of specimen other than nasopharyngeal swab,  presence of viral mutation(s) within the areas targeted by this assay, and inadequate number of viral copies(<138 copies/mL). A negative result must be combined with clinical observations, patient history, and epidemiological information. The expected result is Negative.  Fact Sheet for Patients:  EntrepreneurPulse.com.au  Fact Sheet for Healthcare Providers:  IncredibleEmployment.be  This test is no t yet approved or cleared by the [place] FDA and  has been authorized for detection and/or diagnosis of SARS-CoV-2 by FDA under an Emergency Use Authorization (EUA). This EUA will remain  in effect (meaning this test can be used) for the duration of the COVID-19 declaration under Section 564(b)(1) of the Act, 21 U.S.C.section 360bbb-3(b)(1), unless the authorization is terminated  or revoked sooner.       Influenza A by PCR NEGATIVE NEGATIVE Final   Influenza B by PCR NEGATIVE NEGATIVE Final    Comment: (NOTE) The Xpert Xpress SARS-CoV-2/FLU/RSV plus assay is intended as an aid in the diagnosis of influenza from Nasopharyngeal swab specimens and should not be used as a sole basis for treatment. Nasal washings and aspirates are unacceptable for Xpert Xpress SARS-CoV-2/FLU/RSV testing.  Fact Sheet for Patients: EntrepreneurPulse.com.au  Fact Sheet for Healthcare Providers: IncredibleEmployment.be  This test is not yet approved or cleared by the [place] FDA and has been authorized for detection and/or diagnosis of SARS-CoV-2 by FDA under an Emergency Use Authorization (EUA). This EUA will remain in effect (meaning this test can be used) for the duration of the COVID-19 declaration under Section 564(b)(1) of the Act, 21 U.S.C. section 360bbb-3(b)(1), unless the authorization is terminated or revoked.  Performed at [facility], [address], [place], [place] [address]   Group A Strep by PCR      Status: None   Collection Time: 12/04/21  9:18 AM   Specimen: Anterior Nasal Swab; Sterile Swab  Result Value Ref Range Status   Group A Strep by PCR NOT DETECTED NOT DETECTED Final    Comment: Performed at [facility], [address] [place], [place] [address]  Blood Culture (routine x 2)     Status: None (Preliminary result)   Collection Time: 12/04/21  9:20 AM   Specimen: BLOOD RIGHT FOREARM  Result Value Ref Range Status   Specimen Description   Final    BLOOD RIGHT FOREARM BOTTLES DRAWN AEROBIC AND ANAEROBIC   Special Requests Blood Culture adequate volume  Final   Culture   Final    NO GROWTH < 24 HOURS Performed at [facility] Health, [address], [place], [place] [address]    Report Status PENDING  Incomplete    Today   Subjective    David [name] today has no new concerns  No further fevers - No Nausea, Vomiting or Diarrhea Plan of care Discussed with mother and step dad at bedside    Patient has been seen and examined prior to discharge   Objective   Blood pressure 109/64, pulse 90, temperature 97.7 F (36.5 C), temperature source Oral, resp. rate 18, height 6' (1.829  m), weight 59 kg, SpO2 100 %.   Intake/Output Summary (Last 24 hours) at 12/05/2021 1526 Last data filed at 12/05/2021 1300 Gross per 24 hour  Intake 508.68 ml  Output --  Net 508.68 ml   Exam Gen:- Awake Alert, no acute distress  HEENT:- [place].AT, No sclera icterus Neck-Supple Neck,No JVD,.  Lungs-  CTAB , good air movement bilaterally CV- S1, S2 normal, regular Abd-  +ve B.Sounds, Abd Soft, No tenderness,    Extremity/Skin:- No  edema,   good pulses Psych-affect is appropriate, oriented x3 Neuro-no new focal deficits, no tremors    Data Review   CBC w Diff:  Lab Results  Component Value Date   WBC 9.4 12/05/2021   HGB 11.0 (L) 12/05/2021   HCT 34.8 (L) 12/05/2021   PLT 170 12/05/2021   LYMPHOPCT 20 12/04/2021   MONOPCT 8 12/04/2021   EOSPCT 0 12/04/2021   BASOPCT 0 12/04/2021     CMP:  Lab Results  Component Value Date   NA 138 12/05/2021   K 4.4 12/05/2021   CL 106 12/05/2021   CO2 29 12/05/2021   BUN 10 12/05/2021   CREATININE 0.50 (L) 12/05/2021   CREATININE 0.46 (L) 10/21/2021   PROT 8.2 (H) 12/05/2021   ALBUMIN 2.8 (L) 12/05/2021   BILITOT 0.7 12/05/2021   ALKPHOS 61 12/05/2021   AST 56 (H) 12/05/2021   ALT 48 (H) 12/05/2021  .  Total Discharge time is about 33 minutes  Roxan Hockey M.D on 12/05/2021 at 3:26 PM  Go to [email or web address] -  for contact info  [facility] - Office  [phone]

## 2021-12-06 LAB — URINE CULTURE: Culture: NO GROWTH

## 2021-12-06 LAB — RHEUMATOID FACTOR: Rheumatoid fact SerPl-aCnc: 19.4 IU/mL — ABNORMAL HIGH (ref <14.0)

## 2021-12-06 NOTE — TOC Progression Note (Signed)
Transition of Care [facility] - Progression Note    Patient Details  Name: Sophie Quiles MRN: [id number] Date of Birth: Dec 22, 2000  Transition of Care [facility]) CM/SW Contact  Elliot Gault, LCSW Phone Number: 12/06/2021, 9:19 AM  Clinical Narrative:     TOC received message from weekend [facility] requesting follow up with pt to refer to PCP. Pt does not have insurance. This LCSW contacted pt this AM to offer assistance and referral to [facility]. Pt informed this LCSW that he had just left the [facility] clinic and he was all situated with ongoing care. Pt stated he did not have any other TOC concerns at this time.       Expected Discharge Plan and Services           Expected Discharge Date: 12/05/21                                     Social Determinants of Health (SDOH) Interventions    Readmission Risk Interventions     No data to display

## 2021-12-07 ENCOUNTER — Telehealth: Payer: Self-pay

## 2021-12-07 LAB — ENA+DNA/DS+ANTICH+CENTRO+JO...
Anti JO-1: <0.2 AI (ref 0.0–0.9)
Centromere Ab Screen: <0.2 AI (ref 0.0–0.9)
Chromatin Ab SerPl-aCnc: >8 AI — ABNORMAL HIGH (ref 0.0–0.9)
ENA SM Ab Ser-aCnc: >8 AI — ABNORMAL HIGH (ref 0.0–0.9)
Ribonucleic Protein: >8 AI — ABNORMAL HIGH (ref 0.0–0.9)
SSA (Ro) (ENA) Antibody, IgG: <0.2 AI (ref 0.0–0.9)
SSB (La) (ENA) Antibody, IgG: <0.2 AI (ref 0.0–0.9)
Scleroderma (Scl-70) (ENA) Antibody, IgG: <0.2 AI (ref 0.0–0.9)
ds DNA Ab: 5 IU/mL (ref 0–9)

## 2021-12-07 LAB — ANA W/REFLEX IF POSITIVE: Anti Nuclear Antibody (ANA): POSITIVE — AB

## 2021-12-07 NOTE — Telephone Encounter (Signed)
Called to follow up with client as he and family came into [facility] office Monday when I was out of office. Request was for follow up from MSW Nia Jones regarding Mental health services. As Nia is not in the office this week and her last day will be 12/14/21 I followed up.  Client states he is doing okay, denies SI, he continues with fatigue and losing weight, ongoing since September 2022. Client reports he was having issues sleeping for about a week but reports lately he has been sleeping 8 hours a day. He reports he has no appetite and when trying to eat he gets nauseated. Discussed with client that we are available for him to talk to as he has been going through a lot of stressful things with not knowing what is causing his issues. Discussed that he has an upcoming appointment on 12/15/21 at 11 am to establish primary medical care with [facility]. Discussed that in that practice there are mental health services available if needed such as counseling and psych. Discussed that sometimes talking to someone outside his family helps and allows him the freedom to express his concerns or feelings. He states understanding, but states he is "okay right now" Discussed will still have Tobe Sos MSW reach and call him next week. He is agreeable.  After call noted client had been to hospital over the weekend, now has a follow up appointment with infectious disease on 12/09/21. Also received confirmation that his Med[employer]ist has been approved until 11/24/22. During call it seemed as if client was in a public place.  Did text client back and requested he call back at his convenience  Plan: to update him regarding Med[employer]ist approval. Discuss possibly coming into the office next week to meet alone with Nia either Monday or Tuesday . Will continue to follow with client.  Francee Nodal RN [employer]

## 2021-12-09 ENCOUNTER — Inpatient Hospital Stay: Payer: Self-pay | Admitting: Internal Medicine

## 2021-12-09 ENCOUNTER — Ambulatory Visit (INDEPENDENT_AMBULATORY_CARE_PROVIDER_SITE_OTHER): Payer: Medicaid Other | Admitting: Internal Medicine

## 2021-12-09 ENCOUNTER — Other Ambulatory Visit: Payer: Self-pay

## 2021-12-09 ENCOUNTER — Encounter: Payer: Self-pay | Admitting: Internal Medicine

## 2021-12-09 DIAGNOSIS — R768 Other specified abnormal immunological findings in serum: Secondary | ICD-10-CM

## 2021-12-09 DIAGNOSIS — I3139 Other pericardial effusion (noninflammatory): Secondary | ICD-10-CM | POA: Insufficient documentation

## 2021-12-09 LAB — CULTURE, BLOOD (ROUTINE X 2)
Culture: NO GROWTH
Culture: NO GROWTH
Special Requests: ADEQUATE
Special Requests: ADEQUATE

## 2021-12-09 NOTE — Assessment & Plan Note (Signed)
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  He recently signed up for insurance but he is not sure if it is active.  He was directed to social services today to see if his Medicaid can be reactivated.  We will try to facilitate a rheumatology evaluation locally as soon as possible.

## 2021-12-09 NOTE — Progress Notes (Addendum)
[facility] for Infectious Disease  Patient Active Problem List   Diagnosis Date Noted   Elevated LDH 11/04/2021    Priority: High   Positive ANA (antinuclear antibody) 11/04/2021    Priority: High   Generalized lymphadenopathy 11/04/2021    Priority: High   Fatigue 11/04/2021    Priority: High   Arthralgia 11/04/2021    Priority: High   FUO (fever of unknown origin) 11/04/2021    Priority: High   Cough with hemoptysis 11/04/2021    Priority: High   Unintentional weight loss 10/21/2021    Priority: High   Elevated liver enzymes 10/21/2021    Priority: High   Pericardial effusion 12/09/2021   Chest pain 12/04/2021   History of migraine headaches 11/04/2021    Patient's Medications  New Prescriptions   No medications on file  Previous Medications   IBUPROFEN (ADVIL) 600 MG TABLET    Take 1 tablet (600 mg total) by mouth every 6 (six) hours as needed for headache or mild pain. Always take with Food   MIRTAZAPINE (REMERON) 15 MG TABLET    Take 1 tablet (15 mg total) by mouth at bedtime.  Modified Medications   No medications on file  Discontinued Medications   No medications on file    Subjective: David [name] is in for his routine follow-up visit.  He emigrated here from his native [place] when he was 67 years old.  His mother tells me that when he was very little he had lots of "tummy aches".  About 5 years ago he began to struggle with depression and migraine headaches. About 3 years ago he also had an episode of severe abdominal pain. He was evaluated on 1 occasion and was told that he had an ulcer. He was given some liquid medicine to take in his pain slowly improved.  He does not recall having any scans and never underwent EGD.  At some point last year he thinks he was started on sumatriptan and amitriptyline for his headaches.  His headaches became less frequent and severe but he was told to stop the medications when it was noted that his liver enzymes were elevated  last year.  He underwent an abdominal ultrasound in September that did not reveal any abnormalities.  He says that he underwent other testing and that everything was negative for hepatitis.  He was referred to [facility] where he underwent further testing.  A liver biopsy was performed in March which was essentially normal.  He underwent a CT scan of his abdomen and pelvis recently that showed only a small hiatal hernia but was otherwise normal.   He tells me that he has not felt completely well for the last 4 years. He says that he started to feel worse in January of last year.  In addition to the headaches and elevated liver enzymes he has been losing weight unintentionally.  He says that he used to weigh 160 pounds but now he has stabilized around 130 pounds even though he is eating more to try to gain weight.  He has not had any nausea, vomiting or diarrhea.  He has also struggled with severe debilitating fatigue such that he had to quit his work at a [employer].  He has continued taking courses as a sophomore at [employer].  For the last several months he had episodes of fever, chills and sweats.  He was also having problems with cough, chest pain and occasional  hemoptysis.  He has also had diffuse aching in his joints.  He used to smoke marijuana but stopped abruptly several months ago.  He denies use of any other recreational drugs.  He has been sexually active but denies ever having any known sexually transmitted infections.   He recently had 2 visits at the cancer center at [facility].  His most recent liver enzymes are down slightly.  However his LDH was quite elevated at 760. His albumin was low and his total protein was elevated.  His HIV antigen and antibody tests are negative.  He tells me that he believes he had a normal chest x-ray as well.  Epstein-Barr virus serology was obtained which showed a negative viral capsid antigen IgM, positive viral capsid antigen IgG and  positive nuclear antigen IgG.  After his initial visit I was able to get records from his PCP and gastroenterologist which also showed that he had a positive ANA test several months ago.  His RNP and Smith antibodies were also elevated.  Viral hepatitis serologies were all negative.  He has missed 2 recent follow-up visits with me.  He was not feeling any better and was actually admitted to the hospital recently.  While hospitalized he had elevated ANA again.  Autoantibodies against ribonucleoprotein and ENA SM were also elevated.  His rheumatoid factor was also elevated.  Chest CT showed mediastinal adenopathy and a pericardial effusion.  On discharge she was told to arrange a referral to rheumatology at [facility].  He and his mother are not sure how to do that.  He was also started on Remeron in the hospital to help with his insomnia.  He is sleeping better.  There has been no change in his depression.    Review of Systems: Review of Systems  Constitutional:  Positive for chills, diaphoresis, fever, malaise/fatigue and weight loss.  HENT:  Negative for congestion and sore throat.   Eyes:  Negative for redness.  Respiratory:  Positive for cough. Negative for hemoptysis.   Cardiovascular:  Positive for chest pain.  Gastrointestinal:  Negative for abdominal pain, diarrhea, nausea and vomiting.  Genitourinary:  Negative for dysuria and hematuria.  Musculoskeletal:  Positive for joint pain and myalgias.  Skin:  Negative for rash.  Neurological:  Positive for headaches.  Psychiatric/Behavioral:  Positive for depression. The patient has insomnia. The patient is not nervous/anxious.     No past medical history on file.  Social History   Tobacco Use   Smoking status: Never   Smokeless tobacco: Never  Vaping Use   Vaping Use: Never used  Substance Use Topics   Alcohol use: Not Currently   Drug use: Not Currently    No family history on file.  Allergies  Allergen Reactions   Fish  Allergy Itching    Objective: Vitals:   12/09/21 1038  BP: 120/80  Pulse: 98  Resp: 16  SpO2: 100%  Weight: 127 lb (57.6 kg)  Height: 6' (1.829 m)   Body mass index is 17.22 kg/m.  Physical Exam Constitutional:      Comments: Is thin, calm and pleasant.  His weight is down 3 pounds since his last visit here.  HENT:     Mouth/Throat:     Mouth: Mucous membranes are moist.     Pharynx: Oropharynx is clear. No oropharyngeal exudate or posterior oropharyngeal erythema.     Comments: No ulcers noted. Eyes:     Conjunctiva/sclera: Conjunctivae normal.  Cardiovascular:     Rate and  Rhythm: Normal rate and regular rhythm.     Heart sounds: No murmur heard.    No friction rub.  Pulmonary:     Effort: Pulmonary effort is normal.     Breath sounds: Normal breath sounds.  Abdominal:     Palpations: Abdomen is soft.     Tenderness: There is no abdominal tenderness.  Musculoskeletal:        General: No swelling or tenderness.     Cervical back: Neck supple.  Lymphadenopathy:     Upper Body:     Right upper body: Axillary adenopathy and epitrochlear adenopathy present. No supraclavicular adenopathy.     Left upper body: Axillary adenopathy and epitrochlear adenopathy present. No supraclavicular adenopathy.  Skin:    Findings: No rash.  Neurological:     General: No focal deficit present.  Psychiatric:        Mood and Affect: Mood normal.     Lab Results Lab Results  Component Value Date   WBC 9.4 12/05/2021   HGB 11.0 (L) 12/05/2021   HCT 34.8 (L) 12/05/2021   MCV 87.7 12/05/2021   PLT 170 12/05/2021   CMP     Component Value Date/Time   NA 138 12/05/2021 0508   K 4.4 12/05/2021 0508   CL 106 12/05/2021 0508   CO2 29 12/05/2021 0508   GLUCOSE 92 12/05/2021 0508   BUN 10 12/05/2021 0508   CREATININE 0.50 (L) 12/05/2021 0508   CREATININE 0.46 (L) 10/21/2021 1523   CALCIUM 8.4 (L) 12/05/2021 0508   PROT 8.2 (H) 12/05/2021 0508   ALBUMIN 2.8 (L) 12/05/2021 0508    AST 56 (H) 12/05/2021 0508   ALT 48 (H) 12/05/2021 0508   ALKPHOS 61 12/05/2021 0508   BILITOT 0.7 12/05/2021 0508   GFRNONAA >60 12/05/2021 0508      Problem List Items Addressed This Visit       High   Positive ANA (antinuclear antibody)    It is not totally clear what has caused his smoldering multisystem febrile illness but 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  He recently signed up for insurance but he is not sure if it is active.  He was directed to social services today to see if his Medicaid can be reactivated.  We will try to facilitate a rheumatology evaluation locally as soon as possible.      Relevant Orders   Ambulatory referral to Rheumatology     Unprioritized   Pericardial effusion     Cliffton Asters, MD [facility] for Infectious Disease [facility] [phone] pager   [phone] cell 03/15/2022, 3:58 PM

## 2021-12-09 NOTE — Addendum Note (Signed)
Addended by: Cliffton Asters on: 12/09/2021 11:39 AM   Modules accepted: Orders

## 2021-12-13 ENCOUNTER — Encounter: Payer: Self-pay | Admitting: Internal Medicine

## 2021-12-15 ENCOUNTER — Telehealth: Payer: Self-pay

## 2021-12-16 ENCOUNTER — Telehealth: Payer: Self-pay

## 2021-12-16 NOTE — Telephone Encounter (Signed)
Called to follow up with client after his first appointment with [facility] and his recent appointment with Dr. Orvan Falconer at infectious disease. He states that his appointment went well and we reviewed his next upcoming appointment. He reports increase appetite and he is sleeping better.  Infectious disease is referring him to Rheumatology and he is awaiting that appointment.  Will check regarding his CAFA status and call client back if he needs to complete an application. He thinks he has completed it.  Will check chart and call him back as needed.   Francee Nodal RN [employer]

## 2022-05-15 DIAGNOSIS — R042 Hemoptysis: Secondary | ICD-10-CM | POA: Diagnosis not present

## 2022-05-15 DIAGNOSIS — J849 Interstitial pulmonary disease, unspecified: Secondary | ICD-10-CM | POA: Diagnosis not present

## 2022-05-15 DIAGNOSIS — R918 Other nonspecific abnormal finding of lung field: Secondary | ICD-10-CM | POA: Diagnosis not present

## 2022-05-30 DIAGNOSIS — M351 Other overlap syndromes: Secondary | ICD-10-CM | POA: Diagnosis not present

## 2022-05-30 DIAGNOSIS — R042 Hemoptysis: Secondary | ICD-10-CM | POA: Diagnosis not present

## 2022-05-30 DIAGNOSIS — Z79899 Other long term (current) drug therapy: Secondary | ICD-10-CM | POA: Diagnosis not present

## 2022-05-30 DIAGNOSIS — G43909 Migraine, unspecified, not intractable, without status migrainosus: Secondary | ICD-10-CM | POA: Diagnosis not present

## 2022-05-30 DIAGNOSIS — Z7289 Other problems related to lifestyle: Secondary | ICD-10-CM | POA: Diagnosis not present

## 2022-05-30 DIAGNOSIS — M329 Systemic lupus erythematosus, unspecified: Secondary | ICD-10-CM | POA: Diagnosis not present

## 2022-05-30 DIAGNOSIS — Z23 Encounter for immunization: Secondary | ICD-10-CM | POA: Diagnosis not present

## 2022-05-30 DIAGNOSIS — I7300 Raynaud's syndrome without gangrene: Secondary | ICD-10-CM | POA: Diagnosis not present

## 2022-05-30 DIAGNOSIS — Z87891 Personal history of nicotine dependence: Secondary | ICD-10-CM | POA: Diagnosis not present

## 2022-05-30 DIAGNOSIS — Z6820 Body mass index (BMI) 20.0-20.9, adult: Secondary | ICD-10-CM | POA: Diagnosis not present

## 2022-05-30 DIAGNOSIS — R748 Abnormal levels of other serum enzymes: Secondary | ICD-10-CM | POA: Diagnosis not present

## 2022-05-30 DIAGNOSIS — J849 Interstitial pulmonary disease, unspecified: Secondary | ICD-10-CM | POA: Diagnosis not present

## 2022-05-30 DIAGNOSIS — M064 Inflammatory polyarthropathy: Secondary | ICD-10-CM | POA: Diagnosis not present

## 2022-05-30 DIAGNOSIS — Z7952 Long term (current) use of systemic steroids: Secondary | ICD-10-CM | POA: Diagnosis not present

## 2022-05-30 DIAGNOSIS — R634 Abnormal weight loss: Secondary | ICD-10-CM | POA: Diagnosis not present

## 2022-05-30 DIAGNOSIS — M2550 Pain in unspecified joint: Secondary | ICD-10-CM | POA: Diagnosis not present

## 2022-05-30 DIAGNOSIS — D6489 Other specified anemias: Secondary | ICD-10-CM | POA: Diagnosis not present

## 2022-05-30 DIAGNOSIS — G629 Polyneuropathy, unspecified: Secondary | ICD-10-CM | POA: Diagnosis not present

## 2022-05-30 DIAGNOSIS — Z87898 Personal history of other specified conditions: Secondary | ICD-10-CM | POA: Diagnosis not present

## 2022-05-30 DIAGNOSIS — F32A Depression, unspecified: Secondary | ICD-10-CM | POA: Diagnosis not present

## 2022-06-02 DIAGNOSIS — J849 Interstitial pulmonary disease, unspecified: Secondary | ICD-10-CM | POA: Diagnosis not present

## 2022-06-28 ENCOUNTER — Telehealth: Payer: Self-pay

## 2022-06-28 NOTE — Telephone Encounter (Signed)
Mychart msg sent

## 2023-01-24 DIAGNOSIS — Z79899 Other long term (current) drug therapy: Secondary | ICD-10-CM | POA: Diagnosis not present

## 2023-01-24 DIAGNOSIS — M351 Other overlap syndromes: Secondary | ICD-10-CM | POA: Diagnosis not present

## 2023-01-24 DIAGNOSIS — E559 Vitamin D deficiency, unspecified: Secondary | ICD-10-CM | POA: Diagnosis not present

## 2023-01-24 DIAGNOSIS — F329 Major depressive disorder, single episode, unspecified: Secondary | ICD-10-CM | POA: Diagnosis not present

## 2023-01-31 DIAGNOSIS — F4010 Social phobia, unspecified: Secondary | ICD-10-CM | POA: Diagnosis not present

## 2023-01-31 DIAGNOSIS — F329 Major depressive disorder, single episode, unspecified: Secondary | ICD-10-CM | POA: Diagnosis not present

## 2023-02-13 DIAGNOSIS — H539 Unspecified visual disturbance: Secondary | ICD-10-CM | POA: Diagnosis not present

## 2023-02-13 DIAGNOSIS — M351 Other overlap syndromes: Secondary | ICD-10-CM | POA: Diagnosis not present

## 2023-02-13 DIAGNOSIS — M2550 Pain in unspecified joint: Secondary | ICD-10-CM | POA: Diagnosis not present

## 2023-03-21 ENCOUNTER — Encounter (HOSPITAL_COMMUNITY): Payer: Self-pay

## 2023-03-21 ENCOUNTER — Emergency Department (HOSPITAL_COMMUNITY)
Admission: EM | Admit: 2023-03-21 | Discharge: 2023-03-21 | Disposition: A | Discharge status: Home / Self Care | Payer: Medicaid Other | Attending: Emergency Medicine | Admitting: Emergency Medicine

## 2023-03-21 ENCOUNTER — Other Ambulatory Visit: Payer: Self-pay

## 2023-03-21 DIAGNOSIS — R519 Headache, unspecified: Secondary | ICD-10-CM | POA: Diagnosis not present

## 2023-03-21 HISTORY — DX: Other disorders of lung: J98.4

## 2023-03-21 MED ORDER — KETOROLAC TROMETHAMINE 15 MG/ML IJ SOLN
15.0000 mg | Freq: Once | INTRAMUSCULAR | Status: AC
  Administered 2023-03-21: 15 mg via INTRAVENOUS
  Filled 2023-03-21: qty 1

## 2023-03-21 MED ORDER — SUMATRIPTAN SUCCINATE 100 MG PO TABS: 100.0000 mg | ORAL_TABLET | ORAL | 0 refills | Status: DC | PRN

## 2023-03-21 MED ORDER — METOCLOPRAMIDE HCL 5 MG/ML IJ SOLN
10.0000 mg | Freq: Once | INTRAMUSCULAR | Status: AC
  Administered 2023-03-21: 10 mg via INTRAVENOUS
  Filled 2023-03-21: qty 2

## 2023-03-21 MED ORDER — LIDOCAINE 5 % EX PTCH
1.0000 | MEDICATED_PATCH | CUTANEOUS | Status: DC
  Administered 2023-03-21: 1 via TRANSDERMAL
  Filled 2023-03-21: qty 1

## 2023-03-21 MED ORDER — DIPHENHYDRAMINE HCL 50 MG/ML IJ SOLN
25.0000 mg | Freq: Once | INTRAMUSCULAR | Status: AC
  Administered 2023-03-21: 25 mg via INTRAVENOUS
  Filled 2023-03-21: qty 1

## 2023-03-21 MED ORDER — DEXAMETHASONE SODIUM PHOSPHATE 10 MG/ML IJ SOLN
10.0000 mg | Freq: Once | INTRAMUSCULAR | Status: AC
  Administered 2023-03-21: 10 mg via INTRAVENOUS
  Filled 2023-03-21: qty 1

## 2023-03-21 NOTE — Discharge Instructions (Addendum)
As discussed, we will send a referral for neurology to follow-up with regarding your headache.  Will also send a medication to take as needed for headache.  Please do not hesitate to return to emergency department for worrisome signs and symptoms we discussed become apparent.

## 2023-03-21 NOTE — ED Provider Notes (Addendum)
[place] EMERGENCY DEPARTMENT AT [facility] Provider Note   CSN: [id number] Arrival date & time: 03/21/23  0827     History  Chief Complaint  Patient presents with   Neck Pain    David [name] is a 22 y.o. male.   Neck Pain   22 year old male presents emergency department with complaints of headache/neck pain.  Patient states that he has had intermittent headache/neck pain for the past 6 months.  States that he was told to follow-up with neurology in the outpatient setting but has not called to schedule an appointment.  Reports headache in the back of his head.  Reports pain worsened with movement of his neck.  Denies any traumatic injury.  States it feels similar to other headaches of these had in the past.  Headache occurred yesterday and reports gradual onset with worsening since onset.  Has tried no medications for his symptoms.  Denies any visual symptoms, gait abnormality, slurred speech, facial droop, weakness/sensory deficits in upper or lower extremities, fever.  Past medical history significant for interstitial lung disease, mixed connective tissue disease,  Home Medications Prior to Admission medications   Medication Sig Start Date End Date Taking? Authorizing Provider  SUMAtriptan (IMITREX) 100 MG tablet Take 1 tablet (100 mg total) by mouth every 2 (two) hours as needed for migraine. May repeat in 2 hours if headache persists or recurs. Do not take more than 2 doses in 24 hours 03/21/23  Yes Sherian Maroon A, PA  ibuprofen (ADVIL) 600 MG tablet Take 1 tablet (600 mg total) by mouth every 6 (six) hours as needed for headache or mild pain. Always take with Food 12/05/21   Shon Hale, MD  mirtazapine (REMERON) 15 MG tablet Take 1 tablet (15 mg total) by mouth at bedtime. 12/05/21   Shon Hale, MD      Allergies    Fish allergy    Review of Systems   Review of Systems  Musculoskeletal:  Positive for neck pain.  All other systems reviewed and are  negative.   Physical Exam Updated Vital Signs BP 131/81 (BP Location: Left Arm)   Pulse 98   Temp 98 F (36.7 C) (Oral)   Resp 16   Ht 5\' 11"  (1.803 m)   Wt 65.3 kg   SpO2 100%   BMI 20.08 kg/m  Physical Exam Vitals and nursing note reviewed.  Constitutional:      General: He is not in acute distress.    Appearance: He is well-developed.  HENT:     Head: Normocephalic and atraumatic.  Eyes:     Conjunctiva/sclera: Conjunctivae normal.  Cardiovascular:     Rate and Rhythm: Normal rate and regular rhythm.     Heart sounds: No murmur heard. Pulmonary:     Effort: Pulmonary effort is normal. No respiratory distress.     Breath sounds: Normal breath sounds.  Abdominal:     Palpations: Abdomen is soft.     Tenderness: There is no abdominal tenderness.  Musculoskeletal:        General: No swelling.     Cervical back: Normal range of motion and neck supple. No rigidity.     Comments: No midline tenderness of cervical spine.  Slight tenderness right greater than left paraspinal muscles cervical spine at insertion of occiput  Skin:    General: Skin is warm and dry.     Capillary Refill: Capillary refill takes less than 2 seconds.  Neurological:     Mental Status: He  is alert.     Comments: Alert and oriented to self, place, time and event.   Speech is fluent, clear without dysarthria or dysphasia.   Strength 5/5 in upper/lower extremities   Sensation intact in upper/lower extremities   Normal gait.  CN I not tested  CN II not tested CN III, IV, VI PERRLA and EOMs intact bilaterally  CN V Intact sensation to sharp and light touch to the face  CN VII facial movements symmetric  CN VIII not tested  CN IX, X no uvula deviation, symmetric rise of soft palate  CN XI 5/5 SCM and trapezius strength bilaterally  CN XII Midline tongue protrusion, symmetric L/R movements     Psychiatric:        Mood and Affect: Mood normal.     ED Results / Procedures / Treatments    Labs (all labs ordered are listed, but only abnormal results are displayed) Labs Reviewed - No data to display  EKG None  Radiology No results found.  Procedures Procedures    Medications Ordered in ED Medications  lidocaine (LIDODERM) 5 % 1 patch (1 patch Transdermal Patch Applied 03/21/23 0935)  ketorolac (TORADOL) 15 MG/ML injection 15 mg (15 mg Intravenous Given 03/21/23 0933)  metoCLOPramide (REGLAN) injection 10 mg (10 mg Intravenous Given 03/21/23 0936)  diphenhydrAMINE (BENADRYL) injection 25 mg (25 mg Intravenous Given 03/21/23 0933)  dexamethasone (DECADRON) injection 10 mg (10 mg Intravenous Given 03/21/23 0933)    ED Course/ Medical Decision Making/ A&P                                 Medical Decision Making Risk Prescription drug management.   This patient presents to the ED for concern of headache, this involves an extensive number of treatment options, and is a complaint that carries with it a high risk of complications and morbidity.  The differential diagnosis includes migraine, tension, cluster headache, CVA, cerebral venous thrombosis, meningitis, encephalitis, carotid artery/vertebral artery dissection, muscular strain, torticollis, other   Co morbidities that complicate the patient evaluation  See HPI   Additional history obtained:  Additional history obtained from EMR External records from outside source obtained and reviewed including records   Lab Tests:  N/a   Imaging Studies ordered:  na   Cardiac Monitoring: / EKG:  The patient was maintained on a cardiac monitor.  I personally viewed and interpreted the cardiac monitored which showed an underlying rhythm of: Sinus rhythm   Consultations Obtained:  N/a   Problem List / ED Course / Critical interventions / Medication management  Headache I ordered medication including Toradol, Reglan, Benadryl, Decadron, Lidoderm  Reevaluation of the patient after these medicines showed  that the patient improved I have reviewed the patients home medicines and have made adjustments as needed   Social Determinants of Health:  Denies tobacco, illicit drug use   Test / Admission - Considered:  Headache Vitals signs within normal range and stable throughout visit. 22 year old male presents emergency department complaint of headache.  Headache intermittent for the past 6 months or so.  Headache described as occipital with pain worsened with movement of his neck.  On exam, patient without obvious neck stiffness/rigidity of range of motion.  Tender to palpation paraspinally right greater than left and upper cervical region.  Patient with nonfocal neurologic exam.  Treated with migraine cocktail and did note significant improvement.  Given patient's intermittent headache over the past  6 months or so, offered patient CT imaging of his head but this would require transfer to another facility given that our CT scanner is currently down.  Patient elected for treatment of headache and assessment of symptoms thereafter.  Reevaluation of the patient showed near resolution of headache.  Patient without abrupt onset, worst headache of his life; low suspicion for [facility].  Patient without anterior neck pain radiating to head, neurodeficit, exertional mediated symptom onset; low suspicion for carotid artery/vertebral artery dissection/aneurysm.  Patient without fever, nuchal rigidity; low suspicion for meningitis.  Suspect the patient's symptoms likely secondary to migraine/tension headache.  Will discharge with medication to take as needed for headache and recommend close follow-up with neurology in the outpatient setting regarding recurrent headaches.  Treatment plan discussed length with patient and he acknowledged understanding was agreeable to said plan.  Patient overall well-appearing, afebrile in no acute distress. Worrisome signs and symptoms were discussed with the patient, and the patient  acknowledged understanding to return to the ED if noticed. Patient was stable upon discharge.          Final Clinical Impression(s) / ED Diagnoses Final diagnoses:  Bad headache    Rx / DC Orders ED Discharge Orders          Ordered    Ambulatory referral to Neurology       Comments: An appointment is requested in approximately: 4 weeks   03/21/23 1017    SUMAtriptan (IMITREX) 100 MG tablet  Every 2 hours PRN        03/21/23 1017               Peter Garter, [place] 03/21/23 1029    Tanda Rockers A, DO 03/26/23 1011

## 2023-03-21 NOTE — ED Triage Notes (Signed)
Pt c/o neck pain starting yesterday while at work. Pt denies injury to area. Pt states hx of this happening before but would get a headache with it. Pt denies headache today. Pt states a pressure feeling in his neck an 8/10.

## 2023-05-21 IMAGING — US US BIOPSY CORE LIVER
1 series · 11 of 11 positions shown · non-contrast
Comparison: none

CLINICAL DATA: Elevated LFTs, possible autoimmune hepatitis

EXAM:
ULTRASOUND-GUIDED CORE LIVER BIOPSY
TECHNIQUE: An ultrasound guided liver biopsy was thoroughly discussed with the
patient and questions were answered. The benefits, risks,
alternatives, and complications were also discussed. The patient
understands and wishes to proceed with the procedure. A verbal as
well as written consent was obtained.

[Series 1: us biopsy (liver) · 11 of 11 slices shown]
[im 1/11]
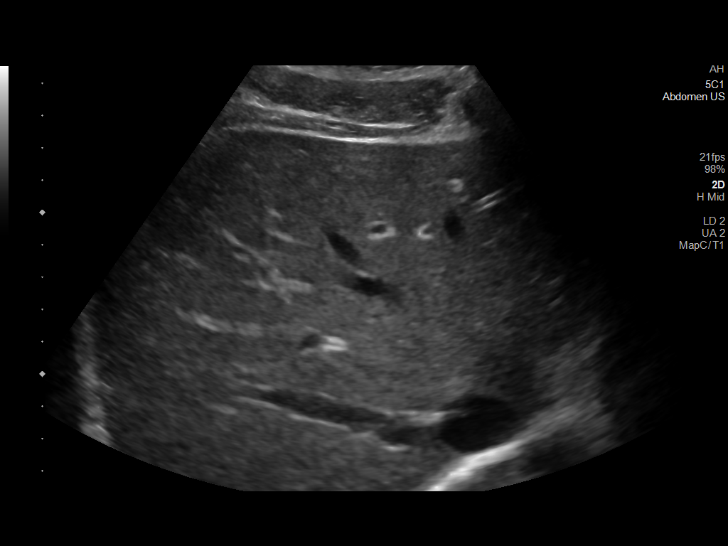
[im 2/11]
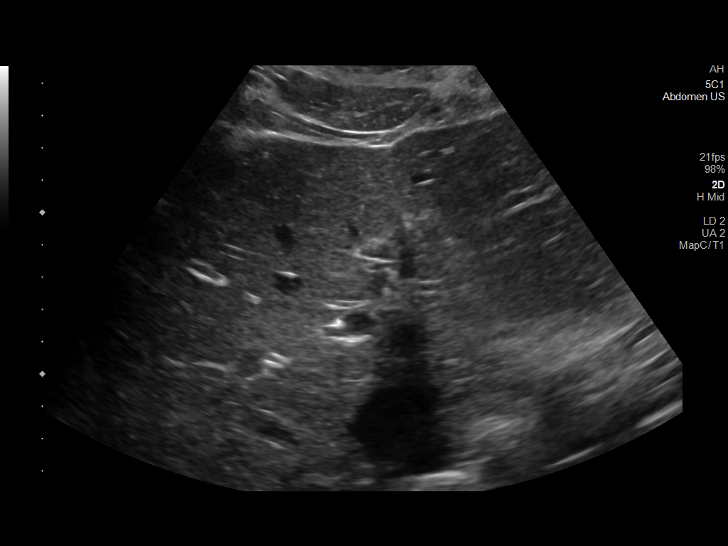
[im 3/11]
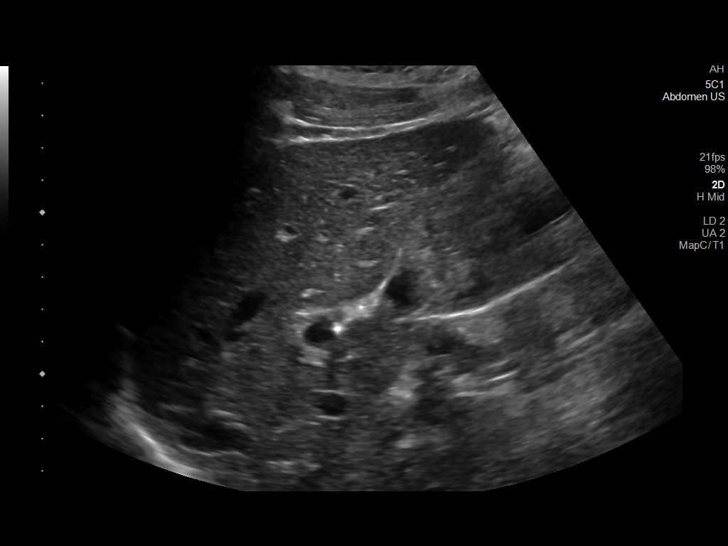
[im 4/11]
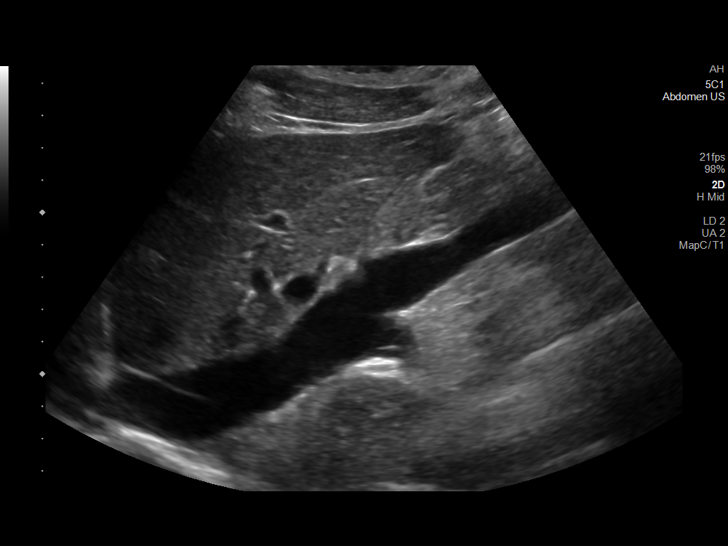
[im 5/11]
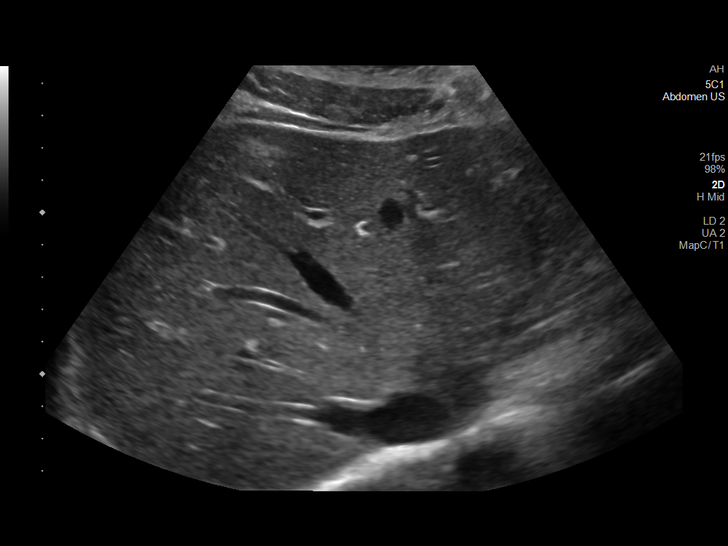
[im 6/11]
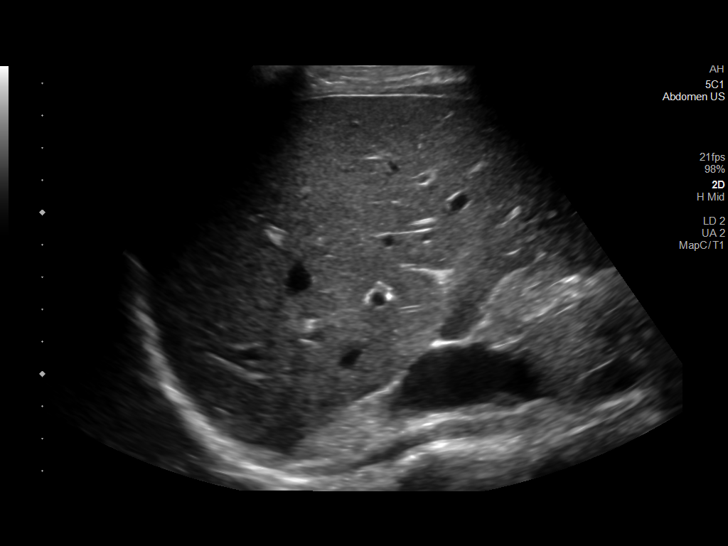
[im 7/11]
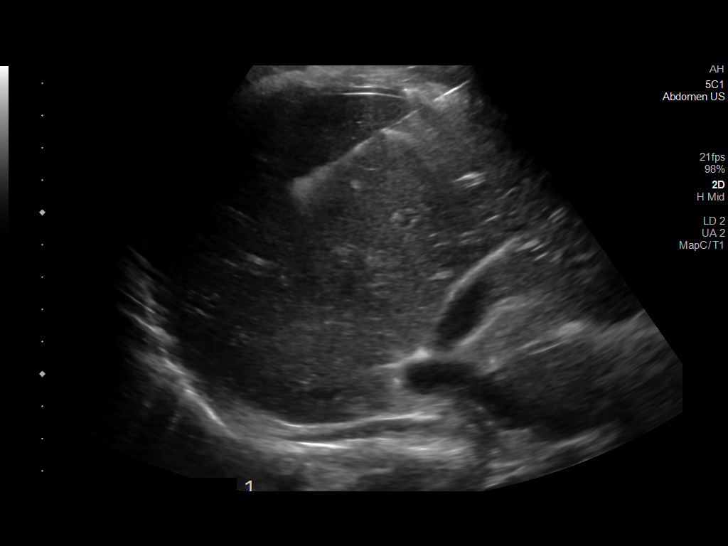
[im 8/11]
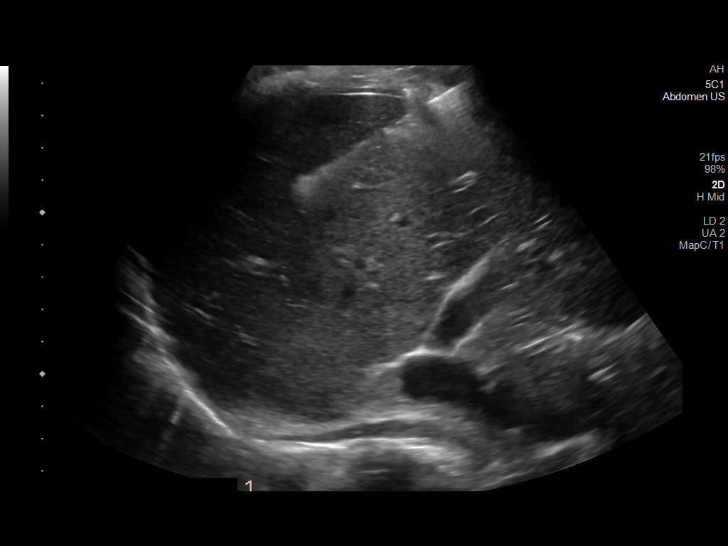
[im 9/11]
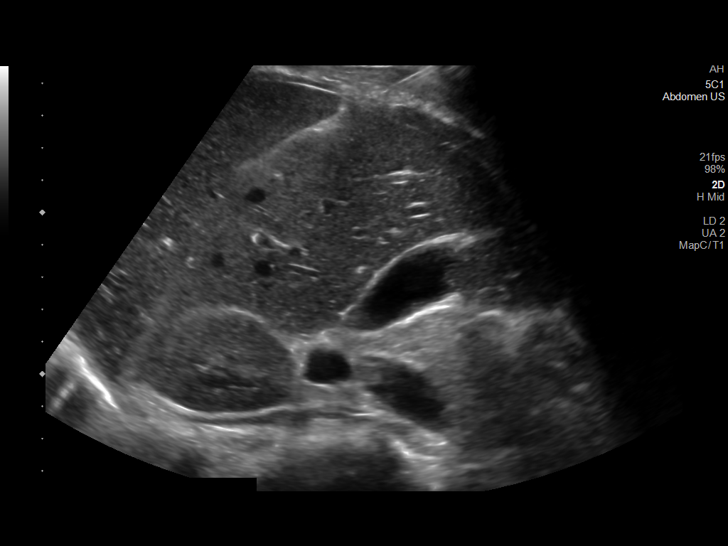
[im 10/11]
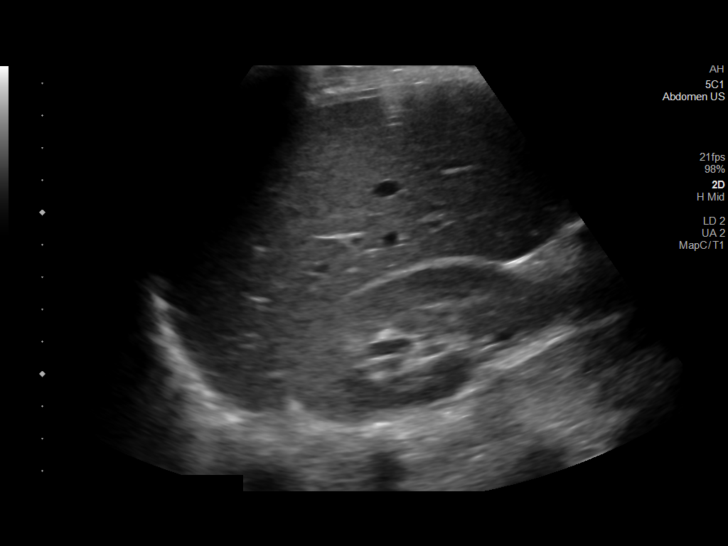
[im 11/11]
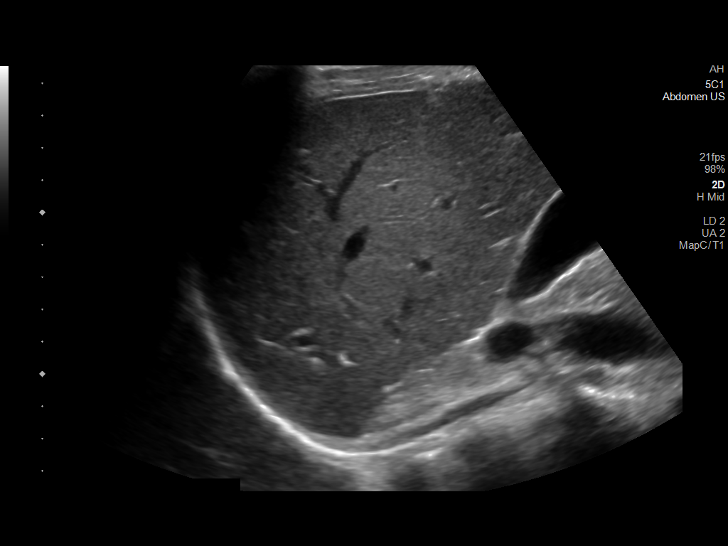

[11 of 11 positions shown; findings below may reference images not displayed]

Survey ultrasound of the liver was performed and an appropriate skin
entry site was determined. Skin site was marked, prepped with
Betadine, and draped in usual sterile fashion, and infiltrated
locally with 1% lidocaine.

Intravenous Fentanyl 99mcg and Versed 1.5mg were administered as
conscious sedation during continuous monitoring of the patient's
level of consciousness and physiological / cardiorespiratory status
by the radiology RN, with a total moderate sedation time of 13
minutes.

A 17 gauge trocar needle was advanced under ultrasound guidance into
the liver. 2 solid-appearing coaxial 75gauge core samples were then
obtained through the guide needle. The guide needle was removed.
Post procedure scans demonstrate no apparent complication.

COMPLICATIONS:
COMPLICATIONS
None immediate
FINDINGS: Survey ultrasound shows no focal liver lesion. Representative core
biopsy samples of the right lobe obtained.
IMPRESSION: 1. Technically successful ultrasound guided core liver biopsy.

## 2023-06-05 DIAGNOSIS — F4010 Social phobia, unspecified: Secondary | ICD-10-CM | POA: Diagnosis not present

## 2023-06-05 DIAGNOSIS — F329 Major depressive disorder, single episode, unspecified: Secondary | ICD-10-CM | POA: Diagnosis not present

## 2023-07-10 DIAGNOSIS — F329 Major depressive disorder, single episode, unspecified: Secondary | ICD-10-CM | POA: Diagnosis not present

## 2023-07-10 DIAGNOSIS — F4010 Social phobia, unspecified: Secondary | ICD-10-CM | POA: Diagnosis not present

## 2023-12-14 ENCOUNTER — Ambulatory Visit
Admission: EM | Admit: 2023-12-14 | Discharge: 2023-12-14 | Disposition: A | Discharge status: Home / Self Care | Attending: Family Medicine | Admitting: Family Medicine

## 2023-12-14 DIAGNOSIS — Z23 Encounter for immunization: Secondary | ICD-10-CM | POA: Diagnosis not present

## 2023-12-14 DIAGNOSIS — L03011 Cellulitis of right finger: Secondary | ICD-10-CM

## 2023-12-14 DIAGNOSIS — S61212A Laceration without foreign body of right middle finger without damage to nail, initial encounter: Secondary | ICD-10-CM

## 2023-12-14 MED ORDER — MUPIROCIN 2 % EX OINT
1.0000 | TOPICAL_OINTMENT | Freq: Two times a day (BID) | CUTANEOUS | 0 refills | Status: DC
Start: 2023-12-14 — End: 2024-01-06

## 2023-12-14 MED ORDER — CHLORHEXIDINE GLUCONATE 4 % EX SOLN: Freq: Every day | CUTANEOUS | 0 refills | Status: DC | PRN

## 2023-12-14 MED ORDER — BACITRACIN 500 UNIT/GM EX OINT
1.0000 | TOPICAL_OINTMENT | Freq: Two times a day (BID) | CUTANEOUS | Status: DC
  Administered 2023-12-14: 1 via TOPICAL

## 2023-12-14 MED ORDER — TETANUS-DIPHTH-ACELL PERTUSSIS 5-2.5-18.5 LF-MCG/0.5 IM SUSY
0.5000 mL | PREFILLED_SYRINGE | Freq: Once | INTRAMUSCULAR | Status: AC
Start: 2023-12-14 — End: 2023-12-14
  Administered 2023-12-14: 0.5 mL via INTRAMUSCULAR

## 2023-12-14 MED ORDER — CEPHALEXIN 500 MG PO CAPS: 500.0000 mg | ORAL_CAPSULE | Freq: Two times a day (BID) | ORAL | 0 refills | Status: DC

## 2023-12-14 NOTE — ED Provider Notes (Signed)
 [facility]    CSN: [id number] Arrival date & time: 12/14/23  9140      History   Chief Complaint No chief complaint on file.   HPI David [name] is a 23 y.o. male.   Patient presenting today with 3-day history of progressively worsening pain, swelling, drainage to the right middle finger.  States he was grabbing a [employer] fast streamer that cut his finger at onset of symptoms.  Has been cleaning it with warm water and applying triple antibiotic ointment but notes the pain, swelling, redness has progressively worsened since yesterday.  Denies fever, chills, numbness, tingling, loss of range of motion.    Past Medical History:  Diagnosis Date   Lung disease     Patient Active Problem List   Diagnosis Date Noted   Pericardial effusion 12/09/2021   Chest pain 12/04/2021   Elevated LDH 11/04/2021   Positive ANA (antinuclear antibody) 11/04/2021   Generalized lymphadenopathy 11/04/2021   Fatigue 11/04/2021   Arthralgia 11/04/2021   FUO (fever of unknown origin) 11/04/2021   Cough with hemoptysis 11/04/2021   History of migraine headaches 11/04/2021   Unintentional weight loss 10/21/2021   Elevated liver enzymes 10/21/2021    History reviewed. No pertinent surgical history.     Home Medications    Prior to Admission medications   Medication Sig Start Date End Date Taking? Authorizing Provider  cephALEXin  (KEFLEX ) 500 MG capsule Take 1 capsule (500 mg total) by mouth 2 (two) times daily. 12/14/23  Yes Stuart Vernell Norris, PA-C  chlorhexidine  (HIBICLENS ) 4 % external liquid Apply topically daily as needed. 12/14/23  Yes Stuart Vernell Norris, PA-C  mupirocin  ointment (BACTROBAN ) 2 % Apply 1 Application topically 2 (two) times daily. 12/14/23  Yes Stuart Vernell Norris, PA-C  ibuprofen  (ADVIL ) 600 MG tablet Take 1 tablet (600 mg total) by mouth every 6 (six) hours as needed for headache or mild pain. Always take with Food 12/05/21   Pearlean Manus, MD  mirtazapine   (REMERON ) 15 MG tablet Take 1 tablet (15 mg total) by mouth at bedtime. 12/05/21   Pearlean Manus, MD  SUMAtriptan  (IMITREX ) 100 MG tablet Take 1 tablet (100 mg total) by mouth every 2 (two) hours as needed for migraine. May repeat in 2 hours if headache persists or recurs. Do not take more than 2 doses in 24 hours 03/21/23   Silver Wonda LABOR, PA    Family History History reviewed. No pertinent family history.  Social History Social History   Tobacco Use   Smoking status: Never   Smokeless tobacco: Never  Vaping Use   Vaping status: Never Used  Substance Use Topics   Alcohol use: Not Currently   Drug use: Not Currently     Allergies   Fish allergy   Review of Systems Review of Systems Per HPI  Physical Exam Triage Vital Signs ED Triage Vitals  Encounter Vitals Group     BP 12/14/23 0922 133/81     Girls Systolic BP Percentile --      Girls Diastolic BP Percentile --      Boys Systolic BP Percentile --      Boys Diastolic BP Percentile --      Pulse Rate 12/14/23 0922 (!) 126     Resp 12/14/23 0922 20     Temp 12/14/23 0922 98.7 F (37.1 C)     Temp Source 12/14/23 0922 Oral     SpO2 12/14/23 0922 95 %     Weight --  Height --      Head Circumference --      Peak Flow --      Pain Score 12/14/23 0924 7     Pain Loc --      Pain Education --      Exclude from Growth Chart --    No data found.  Updated Vital Signs BP 133/81 (BP Location: Right Arm)   Pulse (!) 126   Temp 98.7 F (37.1 C) (Oral)   Resp 20   SpO2 95%   Visual Acuity Right Eye Distance:   Left Eye Distance:   Bilateral Distance:    Right Eye Near:   Left Eye Near:    Bilateral Near:     Physical Exam Vitals and nursing note reviewed.  Constitutional:      Appearance: Normal appearance.  HENT:     Head: Atraumatic.  Eyes:     Extraocular Movements: Extraocular movements intact.     Conjunctiva/sclera: Conjunctivae normal.  Cardiovascular:     Rate and Rhythm: Normal  rate.  Pulmonary:     Effort: Pulmonary effort is normal.  Musculoskeletal:        General: Swelling, tenderness and signs of injury present. Normal range of motion.     Cervical back: Normal range of motion and neck supple.  Skin:    General: Skin is warm.     Findings: Erythema present.     Comments: Healing laceration to the distal right middle finger with surrounding dried drainage, erythema, edema, warmth.  Area tender to palpation.  Neurological:     General: No focal deficit present.     Mental Status: He is oriented to person, place, and time.     Comments: Right upper extremity neurovascularly intact  Psychiatric:        Mood and Affect: Mood normal.        Thought Content: Thought content normal.        Judgment: Judgment normal.      UC Treatments / Results  Labs (all labs ordered are listed, but only abnormal results are displayed) Labs Reviewed - No data to display  EKG   Radiology No results found.  Procedures Procedures (including critical care time)  Medications Ordered in UC Medications  Tdap (BOOSTRIX) injection 0.5 mL (0.5 mLs Intramuscular Given 12/14/23 1004)    Initial Impression / Assessment and Plan / UC Course  I have reviewed the triage vital signs and the nursing notes.  Pertinent labs & imaging results that were available during my care of the patient were reviewed by me and considered in my medical decision making (see chart for details).     Area appears to be becoming infected, we will treat with Keflex , Hibiclens , mupirocin  and good home wound care.  Tdap updated today.  Return for worsening symptoms.  Final Clinical Impressions(s) / UC Diagnoses   Final diagnoses:  Laceration of right middle finger without foreign body without damage to nail, initial encounter  Cellulitis of finger of right hand  Need for Tdap vaccination     Discharge Instructions      Clean the area once to twice daily with the Hibiclens  solution and  apply the mupirocin  ointment and a nonstick dressing.  Continue doing this until the wound is fully resolved.  Take the full course of antibiotics.  Elevate the hand at rest to help with swelling, you may do warm Epsom salt soaks throughout the day as needed.  Follow-up for worsening or unresolving  symptoms.  We have also updated your tetanus shot today.    ED Prescriptions     Medication Sig Dispense Auth. Provider   chlorhexidine  (HIBICLENS ) 4 % external liquid Apply topically daily as needed. 236 mL Stuart Vernell Norris, PA-C   mupirocin  ointment (BACTROBAN ) 2 % Apply 1 Application topically 2 (two) times daily. 60 g Stuart Vernell Norris, PA-C   cephALEXin  (KEFLEX ) 500 MG capsule Take 1 capsule (500 mg total) by mouth 2 (two) times daily. 14 capsule Stuart Vernell Norris, [place]      PDMP not reviewed this encounter.   Stuart Vernell Norris, [place] 12/14/23 1948

## 2023-12-14 NOTE — Discharge Instructions (Signed)
 Clean the area once to twice daily with the Hibiclens  solution and apply the mupirocin  ointment and a nonstick dressing.  Continue doing this until the wound is fully resolved.  Take the full course of antibiotics.  Elevate the hand at rest to help with swelling, you may do warm Epsom salt soaks throughout the day as needed.  Follow-up for worsening or unresolving symptoms.  We have also updated your tetanus shot today.

## 2023-12-14 NOTE — ED Triage Notes (Signed)
 Pt reports injury to right middle finger on the bottom of the finger, x 3 days.pt states he  was grabbing a metal organizer that cut him in the process of picking it up. Swelling, pain  and redness present.

## 2024-01-01 ENCOUNTER — Emergency Department (HOSPITAL_COMMUNITY)
Admission: EM | Admit: 2024-01-01 | Discharge: 2024-01-01 | Disposition: A | Discharge status: Home / Self Care | Attending: Emergency Medicine | Admitting: Emergency Medicine

## 2024-01-01 ENCOUNTER — Encounter (HOSPITAL_COMMUNITY): Payer: Self-pay

## 2024-01-01 ENCOUNTER — Other Ambulatory Visit: Payer: Self-pay

## 2024-01-01 DIAGNOSIS — G43909 Migraine, unspecified, not intractable, without status migrainosus: Secondary | ICD-10-CM | POA: Insufficient documentation

## 2024-01-01 DIAGNOSIS — G43809 Other migraine, not intractable, without status migrainosus: Secondary | ICD-10-CM

## 2024-01-01 DIAGNOSIS — R112 Nausea with vomiting, unspecified: Secondary | ICD-10-CM | POA: Diagnosis present

## 2024-01-01 DIAGNOSIS — J029 Acute pharyngitis, unspecified: Secondary | ICD-10-CM | POA: Insufficient documentation

## 2024-01-01 DIAGNOSIS — B349 Viral infection, unspecified: Secondary | ICD-10-CM

## 2024-01-01 HISTORY — DX: Other overlap syndromes: M35.1

## 2024-01-01 LAB — CBC WITH DIFFERENTIAL/PLATELET
Abs Immature Granulocytes: 0.04 K/uL (ref 0.00–0.07)
Basophils Absolute: 0 K/uL (ref 0.0–0.1)
Basophils Relative: 0 %
Eosinophils Absolute: 0 K/uL (ref 0.0–0.5)
Eosinophils Relative: 0 %
HCT: 42.6 % (ref 39.0–52.0)
Hemoglobin: 14.6 g/dL (ref 13.0–17.0)
Immature Granulocytes: 0 %
Lymphocytes Relative: 20 %
Lymphs Abs: 2 K/uL (ref 0.7–4.0)
MCH: 29.1 pg (ref 26.0–34.0)
MCHC: 34.3 g/dL (ref 30.0–36.0)
MCV: 85 fL (ref 80.0–100.0)
Monocytes Absolute: 1 K/uL (ref 0.1–1.0)
Monocytes Relative: 9 %
Neutro Abs: 7.3 K/uL (ref 1.7–7.7)
Neutrophils Relative %: 71 %
Platelets: 184 K/uL (ref 150–400)
RBC: 5.01 MIL/uL (ref 4.22–5.81)
RDW: 13.7 % (ref 11.5–15.5)
WBC: 10.4 K/uL (ref 4.0–10.5)
nRBC: 0 % (ref 0.0–0.2)

## 2024-01-01 LAB — GROUP A STREP BY PCR: Group A Strep by PCR: NOT DETECTED

## 2024-01-01 LAB — COMPREHENSIVE METABOLIC PANEL WITH GFR
ALT: 43 U/L (ref 0–44)
AST: 50 U/L — ABNORMAL HIGH (ref 15–41)
Albumin: 3.9 g/dL (ref 3.5–5.0)
Alkaline Phosphatase: 78 U/L (ref 38–126)
Anion gap: 11 (ref 5–15)
BUN: 8 mg/dL (ref 6–20)
CO2: 25 mmol/L (ref 22–32)
Calcium: 9.3 mg/dL (ref 8.9–10.3)
Chloride: 99 mmol/L (ref 98–111)
Creatinine, Ser: 0.66 mg/dL (ref 0.61–1.24)
GFR, Estimated: >60 mL/min (ref >60)
Glucose, Bld: 91 mg/dL (ref 70–99)
Potassium: 3.9 mmol/L (ref 3.5–5.1)
Sodium: 135 mmol/L (ref 135–145)
Total Bilirubin: 0.9 mg/dL (ref 0.0–1.2)
Total Protein: 8.9 g/dL — ABNORMAL HIGH (ref 6.5–8.1)

## 2024-01-01 LAB — RESP PANEL BY RT-PCR (RSV, FLU A&B, COVID)  RVPGX2
Influenza A by PCR: NEGATIVE
Influenza B by PCR: NEGATIVE
Resp Syncytial Virus by PCR: NEGATIVE
SARS Coronavirus 2 by RT PCR: NEGATIVE

## 2024-01-01 LAB — MONONUCLEOSIS SCREEN: Mono Screen: NEGATIVE

## 2024-01-01 MED ORDER — KETOROLAC TROMETHAMINE 15 MG/ML IJ SOLN
15.0000 mg | Freq: Once | INTRAMUSCULAR | Status: AC
  Administered 2024-01-01: 15 mg via INTRAVENOUS
  Filled 2024-01-01: qty 1

## 2024-01-01 MED ORDER — DIPHENHYDRAMINE HCL 50 MG/ML IJ SOLN
25.0000 mg | Freq: Once | INTRAMUSCULAR | Status: AC
  Administered 2024-01-01: 25 mg via INTRAVENOUS
  Filled 2024-01-01: qty 1

## 2024-01-01 MED ORDER — NAPROXEN 500 MG PO TABS: 500.0000 mg | ORAL_TABLET | Freq: Two times a day (BID) | ORAL | 0 refills | Status: DC

## 2024-01-01 MED ORDER — DEXAMETHASONE SODIUM PHOSPHATE 10 MG/ML IJ SOLN
10.0000 mg | Freq: Once | INTRAMUSCULAR | Status: AC
  Administered 2024-01-01: 10 mg via INTRAVENOUS
  Filled 2024-01-01: qty 1

## 2024-01-01 MED ORDER — SODIUM CHLORIDE 0.9 % IV BOLUS
1000.0000 mL | Freq: Once | INTRAVENOUS | Status: AC
  Administered 2024-01-01: 1000 mL via INTRAVENOUS

## 2024-01-01 MED ORDER — PROCHLORPERAZINE EDISYLATE 10 MG/2ML IJ SOLN
10.0000 mg | Freq: Once | INTRAMUSCULAR | Status: AC
  Administered 2024-01-01: 10 mg via INTRAVENOUS
  Filled 2024-01-01: qty 2

## 2024-01-01 NOTE — ED Provider Notes (Signed)
 [place] EMERGENCY DEPARTMENT AT [facility] Provider Note   CSN: [id number] Arrival date & time: 01/01/24  9240     Patient presents with: Migraine, Sore Throat, and Emesis   David [name] is a 23 y.o. male.   Patient is a 23 year old male who presents emergency department the chief complaint of headache, sore throat, body aches, nausea and vomiting which has been ongoing for approximate past 3 to 4 days.  Patient notes that he has had pain along the upper aspect of the neck as well.  He denies any cough, nasal congestion.  He notes he has had no chest pain, shortness of breath, abdominal pain.  There is been no associated diarrhea.  He denies any melena, hematochezia, hematemesis.  Patient does have a history of migraines.   Migraine Associated symptoms include headaches.  Sore Throat Associated symptoms include headaches.  Emesis Associated symptoms: headaches        Prior to Admission medications   Medication Sig Start Date End Date Taking? Authorizing Provider  cephALEXin  (KEFLEX ) 500 MG capsule Take 1 capsule (500 mg total) by mouth 2 (two) times daily. 12/14/23   Stuart Vernell Norris, PA-C  chlorhexidine  (HIBICLENS ) 4 % external liquid Apply topically daily as needed. 12/14/23   Stuart Vernell Norris, PA-C  ibuprofen  (ADVIL ) 600 MG tablet Take 1 tablet (600 mg total) by mouth every 6 (six) hours as needed for headache or mild pain. Always take with Food 12/05/21   Pearlean Manus, MD  mirtazapine  (REMERON ) 15 MG tablet Take 1 tablet (15 mg total) by mouth at bedtime. 12/05/21   Pearlean Manus, MD  mupirocin  ointment (BACTROBAN ) 2 % Apply 1 Application topically 2 (two) times daily. 12/14/23   Stuart Vernell Norris, PA-C  SUMAtriptan  (IMITREX ) 100 MG tablet Take 1 tablet (100 mg total) by mouth every 2 (two) hours as needed for migraine. May repeat in 2 hours if headache persists or recurs. Do not take more than 2 doses in 24 hours 03/21/23   Silver Wonda LABOR, PA     Allergies: Fish allergy    Review of Systems  Gastrointestinal:  Positive for vomiting.  Neurological:  Positive for headaches.  All other systems reviewed and are negative.   Updated Vital Signs BP (!) 146/97   Pulse 89   Temp 98.4 F (36.9 C) (Oral)   Resp 17   Ht 5' 11 (1.803 m)   Wt 69.9 kg   SpO2 99%   BMI 21.48 kg/m   Physical Exam Vitals and nursing note reviewed.  Constitutional:      General: He is not in acute distress.    Appearance: Normal appearance. He is not ill-appearing.  HENT:     Head: Normocephalic and atraumatic.     Right Ear: Tympanic membrane and ear canal normal.     Left Ear: Tympanic membrane and ear canal normal.     Nose: Nose normal.     Mouth/Throat:     Mouth: Mucous membranes are moist.     Pharynx: Posterior oropharyngeal erythema present. No oropharyngeal exudate.     Tonsils: No tonsillar exudate or tonsillar abscesses.  Eyes:     Extraocular Movements: Extraocular movements intact.     Conjunctiva/sclera: Conjunctivae normal.     Pupils: Pupils are equal, round, and reactive to light.  Cardiovascular:     Rate and Rhythm: Normal rate and regular rhythm.     Pulses: Normal pulses.     Heart sounds: Normal heart sounds. No murmur  heard.    No gallop.  Pulmonary:     Effort: Pulmonary effort is normal. No respiratory distress.     Breath sounds: Normal breath sounds. No stridor. No wheezing, rhonchi or rales.  Abdominal:     General: Abdomen is flat. Bowel sounds are normal. There is no distension.     Palpations: Abdomen is soft.     Tenderness: There is no abdominal tenderness. There is no rebound.  Musculoskeletal:        General: Normal range of motion.     Cervical back: Normal range of motion and neck supple.  Skin:    General: Skin is warm and dry.     Findings: No rash.  Neurological:     General: No focal deficit present.     Mental Status: He is alert and oriented to person, place, and time. Mental status is  at baseline.  Psychiatric:        Mood and Affect: Mood normal.        Behavior: Behavior normal.        Thought Content: Thought content normal.        Judgment: Judgment normal.     (all labs ordered are listed, but only abnormal results are displayed) Labs Reviewed  RESP PANEL BY RT-PCR (RSV, FLU A&B, COVID)  [id number]  GROUP A STREP BY PCR  COMPREHENSIVE METABOLIC PANEL WITH GFR  CBC WITH DIFFERENTIAL/PLATELET  MONONUCLEOSIS SCREEN    EKG: None  Radiology: No results found.   Procedures   Medications Ordered in the ED  ketorolac  (TORADOL ) 15 MG/ML injection 15 mg (has no administration in time range)  dexamethasone  (DECADRON ) injection 10 mg (has no administration in time range)  prochlorperazine  (COMPAZINE ) injection 10 mg (has no administration in time range)  diphenhydrAMINE  (BENADRYL ) injection 25 mg (has no administration in time range)  sodium chloride  0.9 % bolus 1,000 mL (has no administration in time range)                                    Medical Decision Making Amount and/or Complexity of Data Reviewed Labs: ordered.  Risk Prescription drug management.   This patient presents to the ED for concern of headache, sore throat, nausea differential diagnosis includes migraine, subarachnoid hemorrhage, viral syndrome, strep pharyngitis    Additional history obtained:  Additional history obtained from medical records External records from outside source obtained and reviewed including medical records   Lab Tests:  I Ordered, and personally interpreted labs.  The pertinent results include: No leukocytosis, no anemia, normal kidney function liver function, normal electrolytes, negative strep test, negative mono, negative viral swab    Medicines ordered and prescription drug management:  I ordered medication including Decadron , Benadryl , Compazine , Toradol , IV fluids for migraine Reevaluation of the patient after these medicines showed that the  patient resolved I have reviewed the patients home medicines and have made adjustments as needed   Problem List / ED Course:  Patient is doing well at this time and is stable for discharge home.  Headache has completely resolved with treatment in the emergency department.  Low suspicion for line etiology such as subarachnoid hemorrhage at this point.  Do suspect he is suffering from an acute viral syndrome which exacerbated his headache given his sore throat and nausea.  Workup has been completely unremarkable in the emergency department.  He was negative for COVID-19, influenza, RSV, mono and  strep.  Patient's vital signs are stable with no indication for sepsis.  He has no concerning neurological deficits on exam.  Close follow-up with PCP was discussed as well as strict turn precautions for any new or worsening symptoms.  Will continue symptomatic treatment on outpatient basis.  Patient voiced understanding and had no additional questions.   Social Determinants of Health:  None        Final diagnoses:  None    ED Discharge Orders     None          Daralene Lonni JONETTA DEVONNA 01/01/24 1141    Charlyn Sora, MD 01/02/24 [phone]

## 2024-01-01 NOTE — Discharge Instructions (Signed)
 Please follow-up closely with your primary care doctor on an outpatient basis.  Return to emergency department immediately for any new or worsening symptoms.

## 2024-01-01 NOTE — ED Triage Notes (Signed)
 Pt c/o migraine, sore throat and vomiting x 3 days. Pt states OTC meds are not helping. Pt denies, cough nasal drainage and fever.

## 2024-01-03 ENCOUNTER — Ambulatory Visit
Admission: EM | Admit: 2024-01-03 | Discharge: 2024-01-03 | Disposition: A | Discharge status: Home / Self Care | Attending: Family Medicine | Admitting: Family Medicine

## 2024-01-03 DIAGNOSIS — H66002 Acute suppurative otitis media without spontaneous rupture of ear drum, left ear: Secondary | ICD-10-CM

## 2024-01-03 DIAGNOSIS — J3489 Other specified disorders of nose and nasal sinuses: Secondary | ICD-10-CM | POA: Diagnosis not present

## 2024-01-03 DIAGNOSIS — R110 Nausea: Secondary | ICD-10-CM | POA: Diagnosis not present

## 2024-01-03 LAB — POC COVID19/FLU A&B COMBO
Covid Antigen, POC: NEGATIVE
Influenza A Antigen, POC: NEGATIVE
Influenza B Antigen, POC: NEGATIVE

## 2024-01-03 MED ORDER — AMOXICILLIN-POT CLAVULANATE 875-125 MG PO TABS: 1.0000 | ORAL_TABLET | Freq: Two times a day (BID) | ORAL | 0 refills | Status: DC

## 2024-01-03 MED ORDER — ONDANSETRON 4 MG PO TBDP: 4.0000 mg | ORAL_TABLET | Freq: Three times a day (TID) | ORAL | 0 refills | Status: DC | PRN

## 2024-01-03 NOTE — ED Provider Notes (Signed)
 [facility]   [id number] 01/03/24 Arrival Time: 0809  ASSESSMENT & PLAN:  1. Non-recurrent acute suppurative otitis media of left ear without spontaneous rupture of tympanic membrane   2. Sinus pressure   3. Nausea without vomiting    Begin: Meds ordered this encounter  Medications   amoxicillin -clavulanate (AUGMENTIN ) 875-125 MG tablet    Sig: Take 1 tablet by mouth every 12 (twelve) hours.    Dispense:  20 tablet    Refill:  0   ondansetron  (ZOFRAN -ODT) 4 MG disintegrating tablet    Sig: Take 1 tablet (4 mg total) by mouth every 8 (eight) hours as needed for nausea or vomiting.    Dispense:  15 tablet    Refill:  0   May be developing sinus infection. Discussed. Is tolerating PO intake.   Follow-up Information     Bucio, Silvio BROCKS, FNP.   Specialty: Family Medicine Why: As needed. Contact information: [address] [place] [address] [phone]         [facility] [place].   Specialty: Urgent Care Why: If worsening or failing to improve as anticipated. Contact information: [address] [place]  [address] [phone]                 Reviewed expectations re: course of current medical issues. Questions answered. Outlined signs and symptoms indicating need for more acute intervention. Patient verbalized understanding. After Visit Summary given.   SUBJECTIVE: History from: patient.  David [name] is a 23 y.o. male who presents with complaint of left otalgia; without drainage; without bleeding. Onset gradual, a few d ago. Recent cold symptoms: cold for the past week or so. Fever: denies. Overall normal PO intake without n/v. Also with frontal L sinus pressure  Social History   Tobacco Use  Smoking Status Former   Types: Cigarettes  Smokeless Tobacco Never      OBJECTIVE:  Vitals:   01/03/24 0843  BP: (!) 137/93  Pulse: 82  Resp: 20  Temp: 98.6 F (37 C)  TempSrc: Oral   SpO2: 98%    General appearance: alert; appears fatigued Head: with frontal L sinus TTP TM: left: erythematous, dull, bulging Neck: supple without LAD Lungs: unlabored respirations, symmetrical air entry; cough: absent; no respiratory distress Skin: warm and dry Psychological: alert and cooperative; normal mood and affect  Allergies  Allergen Reactions   Fish Allergy Itching    Pt states he is allergic to lobster     Past Medical History:  Diagnosis Date   Lung disease    MCTD (mixed connective tissue disease) (HCC)    History reviewed. No pertinent family history. Social History   Socioeconomic History   Marital status: Single    Spouse name: Not on file   Number of children: Not on file   Years of education: Not on file   Highest education level: Not on file  Occupational History   Not on file  Tobacco Use   Smoking status: Former    Types: Cigarettes   Smokeless tobacco: Never  Vaping Use   Vaping status: Never Used  Substance and Sexual Activity   Alcohol use: Yes    Comment: occassionally   Drug use: Not Currently   Sexual activity: Yes  Other Topics Concern   Not on file  Social History Narrative   Not on file   Social Drivers of Health   Financial Resource Strain: Not on file  Food Insecurity: Not  on file  Transportation Needs: Not on file  Physical Activity: Not on file  Stress: Not on file  Social Connections: Not on file  Intimate Partner Violence: Not on file             Rolinda Rogue, MD 01/03/24 [phone]

## 2024-01-03 NOTE — ED Triage Notes (Signed)
 Pt reports sore throat and cough x 2 days, ear fullness on the left side since yesterday.

## 2024-01-06 ENCOUNTER — Other Ambulatory Visit: Payer: Self-pay

## 2024-01-06 ENCOUNTER — Encounter (HOSPITAL_COMMUNITY): Payer: Self-pay

## 2024-01-06 ENCOUNTER — Inpatient Hospital Stay (HOSPITAL_COMMUNITY)
Admission: EM | Admit: 2024-01-06 | Discharge: 2024-01-10 | DRG: 092 | Disposition: A | Discharge status: Home / Self Care | Attending: Internal Medicine | Admitting: Internal Medicine

## 2024-01-06 ENCOUNTER — Emergency Department (HOSPITAL_COMMUNITY)
Admission: EM | Admit: 2024-01-06 | Discharge: 2024-01-06 | Discharge status: Against Medical Advice | Source: Home / Self Care | Attending: Emergency Medicine | Admitting: Emergency Medicine

## 2024-01-06 ENCOUNTER — Emergency Department (HOSPITAL_COMMUNITY)

## 2024-01-06 DIAGNOSIS — M351 Other overlap syndromes: Secondary | ICD-10-CM | POA: Diagnosis present

## 2024-01-06 DIAGNOSIS — I7300 Raynaud's syndrome without gangrene: Secondary | ICD-10-CM | POA: Diagnosis present

## 2024-01-06 DIAGNOSIS — Z87891 Personal history of nicotine dependence: Secondary | ICD-10-CM

## 2024-01-06 DIAGNOSIS — Z79624 Long term (current) use of inhibitors of nucleotide synthesis: Secondary | ICD-10-CM | POA: Diagnosis not present

## 2024-01-06 DIAGNOSIS — Z91013 Allergy to seafood: Secondary | ICD-10-CM

## 2024-01-06 DIAGNOSIS — H9192 Unspecified hearing loss, left ear: Secondary | ICD-10-CM | POA: Diagnosis present

## 2024-01-06 DIAGNOSIS — G08 Intracranial and intraspinal phlebitis and thrombophlebitis: Secondary | ICD-10-CM | POA: Diagnosis present

## 2024-01-06 DIAGNOSIS — I495 Sick sinus syndrome: Secondary | ICD-10-CM | POA: Diagnosis present

## 2024-01-06 DIAGNOSIS — G43909 Migraine, unspecified, not intractable, without status migrainosus: Secondary | ICD-10-CM | POA: Diagnosis present

## 2024-01-06 DIAGNOSIS — I878 Other specified disorders of veins: Secondary | ICD-10-CM | POA: Diagnosis present

## 2024-01-06 DIAGNOSIS — Z202 Contact with and (suspected) exposure to infections with a predominantly sexual mode of transmission: Secondary | ICD-10-CM | POA: Diagnosis present

## 2024-01-06 DIAGNOSIS — Z91199 Patient's noncompliance with other medical treatment and regimen due to unspecified reason: Secondary | ICD-10-CM

## 2024-01-06 DIAGNOSIS — Z5329 Procedure and treatment not carried out because of patient's decision for other reasons: Secondary | ICD-10-CM | POA: Insufficient documentation

## 2024-01-06 DIAGNOSIS — Z791 Long term (current) use of non-steroidal anti-inflammatories (NSAID): Secondary | ICD-10-CM | POA: Diagnosis not present

## 2024-01-06 DIAGNOSIS — Z79899 Other long term (current) drug therapy: Secondary | ICD-10-CM | POA: Diagnosis not present

## 2024-01-06 DIAGNOSIS — D6869 Other thrombophilia: Secondary | ICD-10-CM | POA: Diagnosis present

## 2024-01-06 DIAGNOSIS — E860 Dehydration: Secondary | ICD-10-CM | POA: Diagnosis present

## 2024-01-06 DIAGNOSIS — M609 Myositis, unspecified: Secondary | ICD-10-CM | POA: Diagnosis present

## 2024-01-06 DIAGNOSIS — J8489 Other specified interstitial pulmonary diseases: Secondary | ICD-10-CM | POA: Diagnosis present

## 2024-01-06 DIAGNOSIS — M1380 Other specified arthritis, unspecified site: Secondary | ICD-10-CM | POA: Diagnosis present

## 2024-01-06 DIAGNOSIS — M359 Systemic involvement of connective tissue, unspecified: Secondary | ICD-10-CM | POA: Diagnosis not present

## 2024-01-06 DIAGNOSIS — M7989 Other specified soft tissue disorders: Secondary | ICD-10-CM | POA: Diagnosis not present

## 2024-01-06 DIAGNOSIS — D6859 Other primary thrombophilia: Secondary | ICD-10-CM | POA: Diagnosis not present

## 2024-01-06 DIAGNOSIS — R519 Headache, unspecified: Secondary | ICD-10-CM | POA: Insufficient documentation

## 2024-01-06 LAB — BASIC METABOLIC PANEL WITH GFR
Anion gap: 10 (ref 5–15)
BUN: 7 mg/dL (ref 6–20)
CO2: 26 mmol/L (ref 22–32)
Calcium: 9.1 mg/dL (ref 8.9–10.3)
Chloride: 101 mmol/L (ref 98–111)
Creatinine, Ser: 0.67 mg/dL (ref 0.61–1.24)
GFR, Estimated: >60 mL/min (ref >60)
Glucose, Bld: 84 mg/dL (ref 70–99)
Potassium: 4.4 mmol/L (ref 3.5–5.1)
Sodium: 137 mmol/L (ref 135–145)

## 2024-01-06 LAB — CBC WITH DIFFERENTIAL/PLATELET
Abs Immature Granulocytes: 0.03 K/uL (ref 0.00–0.07)
Basophils Absolute: 0 K/uL (ref 0.0–0.1)
Basophils Relative: 0 %
Eosinophils Absolute: 0 K/uL (ref 0.0–0.5)
Eosinophils Relative: 1 %
HCT: 41.2 % (ref 39.0–52.0)
Hemoglobin: 13.7 g/dL (ref 13.0–17.0)
Immature Granulocytes: 0 %
Lymphocytes Relative: 20 %
Lymphs Abs: 1.7 K/uL (ref 0.7–4.0)
MCH: 29.1 pg (ref 26.0–34.0)
MCHC: 33.3 g/dL (ref 30.0–36.0)
MCV: 87.7 fL (ref 80.0–100.0)
Monocytes Absolute: 0.6 K/uL (ref 0.1–1.0)
Monocytes Relative: 7 %
Neutro Abs: 6 K/uL (ref 1.7–7.7)
Neutrophils Relative %: 72 %
Platelets: 284 K/uL (ref 150–400)
RBC: 4.7 MIL/uL (ref 4.22–5.81)
RDW: 13.8 % (ref 11.5–15.5)
WBC: 8.4 K/uL (ref 4.0–10.5)
nRBC: 0 % (ref 0.0–0.2)

## 2024-01-06 LAB — HEPARIN LEVEL (UNFRACTIONATED): Heparin Unfractionated: 0.19 [IU]/mL — ABNORMAL LOW (ref 0.30–0.70)

## 2024-01-06 MED ORDER — GADOBUTROL 1 MMOL/ML IV SOLN
7.0000 mL | Freq: Once | INTRAVENOUS | Status: AC | PRN
  Administered 2024-01-06: 7 mL via INTRAVENOUS

## 2024-01-06 MED ORDER — POLYETHYLENE GLYCOL 3350 17 G PO PACK: 17.0000 g | PACK | Freq: Every day | ORAL | Status: DC | PRN

## 2024-01-06 MED ORDER — ACETAMINOPHEN 325 MG PO TABS
650.0000 mg | ORAL_TABLET | Freq: Four times a day (QID) | ORAL | Status: DC | PRN
  Administered 2024-01-07 (×3): 650 mg via ORAL
  Filled 2024-01-06 (×3): qty 2

## 2024-01-06 MED ORDER — MYCOPHENOLATE MOFETIL 250 MG PO CAPS
1000.0000 mg | ORAL_CAPSULE | Freq: Two times a day (BID) | ORAL | Status: DC
  Administered 2024-01-06 – 2024-01-07 (×3): 1000 mg via ORAL
  Filled 2024-01-06 (×5): qty 4

## 2024-01-06 MED ORDER — HEPARIN BOLUS VIA INFUSION
4000.0000 [IU] | Freq: Once | INTRAVENOUS | Status: AC
  Administered 2024-01-06: 4000 [IU] via INTRAVENOUS

## 2024-01-06 MED ORDER — HYDROXYCHLOROQUINE SULFATE 200 MG PO TABS
200.0000 mg | ORAL_TABLET | Freq: Every day | ORAL | Status: DC
  Administered 2024-01-07 – 2024-01-10 (×4): 200 mg via ORAL
  Filled 2024-01-06 (×5): qty 1

## 2024-01-06 MED ORDER — AMOXICILLIN-POT CLAVULANATE 875-125 MG PO TABS
1.0000 | ORAL_TABLET | Freq: Two times a day (BID) | ORAL | Status: DC
  Administered 2024-01-06 – 2024-01-09 (×6): 1 via ORAL
  Filled 2024-01-06 (×6): qty 1

## 2024-01-06 MED ORDER — ONDANSETRON 4 MG PO TBDP: 4.0000 mg | ORAL_TABLET | Freq: Three times a day (TID) | ORAL | Status: DC | PRN

## 2024-01-06 MED ORDER — DIPHENHYDRAMINE HCL 50 MG/ML IJ SOLN
25.0000 mg | Freq: Once | INTRAMUSCULAR | Status: AC
  Administered 2024-01-06: 25 mg via INTRAVENOUS
  Filled 2024-01-06: qty 1

## 2024-01-06 MED ORDER — ACETAMINOPHEN 650 MG RE SUPP: 650.0000 mg | Freq: Four times a day (QID) | RECTAL | Status: DC | PRN

## 2024-01-06 MED ORDER — HEPARIN (PORCINE) 25000 UT/250ML-% IV SOLN
1350.0000 [IU]/h | INTRAVENOUS | Status: DC
  Administered 2024-01-06: 1100 [IU]/h via INTRAVENOUS
  Administered 2024-01-07: 1300 [IU]/h via INTRAVENOUS
  Administered 2024-01-08 – 2024-01-09 (×2): 1350 [IU]/h via INTRAVENOUS
  Filled 2024-01-06 (×4): qty 250

## 2024-01-06 MED ORDER — PROCHLORPERAZINE EDISYLATE 10 MG/2ML IJ SOLN
10.0000 mg | Freq: Once | INTRAMUSCULAR | Status: AC
  Administered 2024-01-06: 10 mg via INTRAVENOUS
  Filled 2024-01-06: qty 2

## 2024-01-06 MED ORDER — MYCOPHENOLATE MOFETIL 500 MG PO TABS: 1000.0000 mg | ORAL_TABLET | Freq: Two times a day (BID) | ORAL | Status: DC

## 2024-01-06 MED ORDER — SODIUM CHLORIDE 0.9 % IV BOLUS
1000.0000 mL | Freq: Once | INTRAVENOUS | Status: AC
  Administered 2024-01-06: 1000 mL via INTRAVENOUS

## 2024-01-06 NOTE — Progress Notes (Signed)
 PHARMACY - ANTICOAGULATION CONSULT NOTE  Pharmacy Consult for heparin  Indication: dural venous sinus thrombosis  Allergies  Allergen Reactions   Fish Allergy Itching    Pt states he is allergic to lobster     Patient Measurements: Height: 5' 11 (180.3 cm) Weight: 69 kg (152 lb 1.9 oz) IBW/kg (Calculated) : 75.3 HEPARIN  DW (KG): 69  Vital Signs: Temp: 98 F (36.7 C) (09/13 1544) BP: 144/88 (09/13 1544) Pulse Rate: 94 (09/13 1544)  Labs: No results for input(s): HGB, HCT, PLT, APTT, LABPROT, INR, HEPARINUNFRC, HEPRLOWMOCWT, CREATININE, CKTOTAL, CKMB, TROPONINIHS in the last 72 hours.  Estimated Creatinine Clearance: 142.6 mL/min (by C-G formula based on SCr of 0.66 mg/dL).   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 New orders to start IV heparin . Not on anticoagulants prior to admission.   Goal of Therapy:  Heparin  level 0.3-0.7 units/ml Monitor platelets by anticoagulation protocol: Yes   Plan:  Give 4000 units bolus x 1 Start heparin  infusion at 1100 units/hr Check anti-Xa level in 6-8 hours and daily while on heparin  Continue to monitor H&H and platelets  Dempsey Blush PharmD., BCPS Clinical Pharmacist 01/06/2024 3:56 PM

## 2024-01-06 NOTE — ED Triage Notes (Addendum)
 Pt stated that he has had a migraine for 3 weeks. Pt reports fatigue, vomiting, nausea and pain. Hx of migraines

## 2024-01-06 NOTE — ED Provider Notes (Signed)
**David David**  David David   CSN: [id number] Arrival date & time: 01/06/24  1137     Patient presents with: Migraine   David David is [name] 23 y.o. male.  He has history of mixed connective tissue disorder.  Presents ER today complaining of headache x 3 weeks described as constant, every time he gets any relief is when he sleeps, does not wake him from sleep.  He is very sensitive to light and having nausea and vomiting with this.  Denies fever or chills.  Was having some URI symptoms was seen in the ER on 9/8 for this, had negative testing, treated for symptoms and was feeling better and discharged home, subsequently seen on 9/10 at urgent care and prescribed antibiotics for presumed sinusitis and he still having symptoms.  He feels pressure at the base of his skull and his neck that wraps around the front of his head.  He states his neck feels stiff when he tries to move it.  Denies or chills, numbness tingling or weakness, vision changes, difficulty with balance or other symptoms.  He has had headaches in the past but has never had headache that lasted this long or felt like this.    Migraine Associated symptoms include headaches. Pertinent negatives include no abdominal pain.       Prior to Admission medications   Medication Sig Start Date End Date Taking? Authorizing Provider  amoxicillin -clavulanate (AUGMENTIN ) 875-125 MG tablet Take 1 tablet by mouth every 12 (twelve) hours. 01/03/24   Rolinda Rogue, MD  chlorhexidine  (HIBICLENS ) 4 % external liquid Apply topically daily as needed. 12/14/23   Stuart Vernell Norris, PA-C  ibuprofen  (ADVIL ) 600 MG tablet Take 1 tablet (600 mg total) by mouth every 6 (six) hours as needed for headache or mild pain. Always take with Food 12/05/21   Pearlean Manus, MD  mirtazapine  (REMERON ) 15 MG tablet Take 1 tablet (15 mg total) by mouth at bedtime. 12/05/21   Pearlean Manus, MD  mupirocin  ointment (BACTROBAN ) 2  % Apply 1 Application topically 2 (two) times daily. 12/14/23   Stuart Vernell Norris, PA-C  naproxen  (NAPROSYN ) 500 MG tablet Take 1 tablet (500 mg total) by mouth 2 (two) times daily. 01/01/24   Daralene Bruckner D, PA-C  ondansetron  (ZOFRAN -ODT) 4 MG disintegrating tablet Take 1 tablet (4 mg total) by mouth every 8 (eight) hours as needed for nausea or vomiting. 01/03/24   Rolinda Rogue, MD  SUMAtriptan  (IMITREX ) 100 MG tablet Take 1 tablet (100 mg total) by mouth every 2 (two) hours as needed for migraine. May repeat in 2 hours if headache persists or recurs. Do not take more than 2 doses in 24 hours 03/21/23   Silver Wonda LABOR, PA    Allergies: Fish allergy    Review of Systems  Constitutional:  Negative for fever.  Eyes:  Positive for photophobia. Negative for pain and redness.  Gastrointestinal:  Positive for nausea and vomiting. Negative for abdominal pain.  Neurological:  Positive for headaches. Negative for dizziness, syncope, speech difficulty and numbness.    Updated Vital Signs BP 131/89 (BP Location: Right Arm)   Pulse 87   Temp (!) 97.5 F (36.4 C)   Resp 16   Ht 5' 11 (1.803 m)   Wt 69 kg   SpO2 100%   BMI 21.22 kg/m   Physical Exam Vitals and nursing David reviewed.  Constitutional:      General: He is not in acute distress.  Appearance: He is well-developed.  HENT:     Head: Normocephalic and atraumatic.  Eyes:     Conjunctiva/sclera: Conjunctivae normal.  Cardiovascular:     Rate and Rhythm: Normal rate and regular rhythm.     Heart sounds: No murmur heard. Pulmonary:     Effort: Pulmonary effort is normal. No respiratory distress.     Breath sounds: Normal breath sounds.  Abdominal:     Palpations: Abdomen is soft.     Tenderness: There is no abdominal tenderness.  Musculoskeletal:        General: No swelling.     Cervical back: Neck supple.  Skin:    General: Skin is warm and dry.     Capillary Refill: Capillary refill takes less than 2  seconds.  Neurological:     General: No focal deficit present.     Mental Status: He is alert and oriented to person, place, and time.     GCS: GCS eye subscore is 4. GCS verbal subscore is 5. GCS motor subscore is 6.     Cranial Nerves: Cranial nerves 2-12 are intact.     Sensory: Sensation is intact.     Motor: No weakness.     Coordination: Coordination is intact.  Psychiatric:        Mood and Affect: Mood normal.     (all labs ordered are listed, but only abnormal results are displayed) Labs Reviewed - No data to display  EKG: None  Radiology: No results found.   Procedures   Medications Ordered in the ED - No data to display                                  Medical Decision Making Differential diagnose includes but limited to migraine, tension headache, Intracranial hemorrhage, cluster headache, medication overuse headache, intracranial mass, temporal arteritis, other   Having constant headache x 3 weeks.  Is having nausea and vomiting with, and he is having some neck stiffness and having [name] lot of pain in the base of his neck, he has no neurologic deficits but he does have connective tissue disorder.  I discussed with patient that given his persistent headache that I would like to order MRI and MRV specifically looking for possible venous atherosclerosis.  Patient got MRI but subsequently eloped without donated body.  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  I was able to reach patient on his phone and he is going to come back.  He is at home now and is roughly 10 minutes away and is going to come back.  We discussed the severity of this and risk of morbidity and mortality and importance of presenting here immediately.  He is understanding and states he will be back.  Amount and/or Complexity of Data Reviewed Radiology:  ordered.  Risk Prescription drug management.        Final diagnoses:  None    ED Discharge Orders     None          David Sylvan A, PA-C 01/06/24 1513    David Elsie CROME, MD 01/07/24 [phone]

## 2024-01-06 NOTE — Assessment & Plan Note (Addendum)
 Per Care Everywhere manifestations-inflammatory arthritis, Raynaud's, myositis, NSIP-ILD, fatigue, weight loss, trace pleural and trivial pericardial effusions.  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 - Currently on mycophenolate  and Plaquenil  and reports compliance.  Follows with rheumatologist at [facility]

## 2024-01-06 NOTE — ED Triage Notes (Signed)
 Pt seen here today for migraine, MRI showed vascular changes and pt was told to come back.

## 2024-01-06 NOTE — Progress Notes (Addendum)
 PHARMACY - ANTICOAGULATION CONSULT NOTE  Pharmacy Consult for heparin  Indication: dural venous sinus thrombosis  Allergies  Allergen Reactions   Fish Allergy Itching    Pt states he is allergic to lobster     Patient Measurements: Height: 5' 11 (180.3 cm) Weight: 69 kg (152 lb 1.9 oz) IBW/kg (Calculated) : 75.3 HEPARIN  DW (KG): 69  Vital Signs: Temp: 98.1 F (36.7 C) (09/13 2101) BP: 116/80 (09/13 2215) Pulse Rate: 71 (09/13 2215)  Labs: Recent Labs    01/06/24 1236 01/06/24 2319  HGB 13.7  --   HCT 41.2  --   PLT 284  --   HEPARINUNFRC  --  0.19*  CREATININE 0.67  --     Estimated Creatinine Clearance: 142.6 mL/min (by C-G formula based on SCr of 0.67 mg/dL).   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 New orders to start IV heparin . Not on anticoagulants prior to admission.   PM: heparin  level below goal on 1100 units/hr. Per RN, no issues with the heparin  gtt running continuously or signs/symptoms of bleeding. Last CBC stable.  Goal of Therapy:  Heparin  level 0.3-0.5 units/ml  Monitor platelets by anticoagulation protocol: Yes   Plan:  Increase heparin  infusion at 1300 units/hr Check anti-Xa level in 6-8 hours and daily while on heparin  Continue to monitor H&H and platelets  Lynwood Poplar, PharmD, BCPS Clinical Pharmacist 01/06/2024 11:40 PM

## 2024-01-06 NOTE — ED Notes (Addendum)
 Patient not visible on the floor and staff was not notified that patient would be leaving. Attempted to contact the patient and no answer. Pateints mother contacted and she says she was informed by her son, the patient that he was discharged, I explained to her that he was not.I then attempted to call the patient again and he answered, he stated he took his IV out when he got home and he bleed a little then it stopped. This nurse educated patient that it was not safe to leave with IV still in arm. Patient says he understands but was tired of waiting.

## 2024-01-06 NOTE — Assessment & Plan Note (Signed)
 Patient with migraines nausea and vomiting with fatigue. No focal neurologic deficits.  MRI brain, MR MRV head- Dural venous sinus thrombosis involving the majority of the superior sagittal sinus and the dominant left transverse and sigmoid sinuses.  History of connective tissue disease.  No prior history of blood clots. -EDP talked to neurology, IV heparin  started, admit to [facility]

## 2024-01-06 NOTE — H&P (Signed)
 History and Physical    David [name]:[id number] DOB: 2000-11-14 DOA: 01/06/2024  PCP: Alston Silvio BROCKS, FNP   Patient coming from: Home  I have personally briefly reviewed patient's old medical records in [facility]  Chief Complaint: Headaches  HPI: David [name] is a 23 y.o. male with medical history significant for mixed connective tissue disease. Patient presented to the ED with complaints of ongoing headaches of 3 weeks duration.  Reports fatigue nausea and vomiting with headaches. Headaches feel like pressure around the base of the skull, with neck stiffness.  On ED 9/18 and exam resolved in the ED, and urgent care 9/10 was diagnosed with acute suppurative otitis media of the left ear based on his cold symptoms.  He was started on a course of Augmentin . Weakness of his extremities, no facial asymmetry, no speech changes.  He denies history of blood clots.  No pain or swelling swelling of lower extremities.  ED Course: Stable Vitals.  Unremarkable CBC BMP.  MR brain without contrast- Extensive dural sinus thrombosis involving the superior sagittal sinus, left transverse sinus, and left sigmoid sinus. Cortical veins are patent. Left mastoid effusion. No obstructing nasopharyngeal lesion. 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  Patient initially presented to the ED earlier in the day, and eloped from the ED, because he got tired of waiting and his headache had improved with conservative management in the ED. When MRI resulted, he was called to come back to the ED.  EDP talked to neurologist on-call, Dr. Michaela, commended admission to [facility].  Heparin  drip.  Review of Systems: As per HPI all other systems reviewed and negative.  Past Medical History:  Diagnosis Date   Lung disease    MCTD (mixed connective tissue disease) (HCC)     History reviewed. No  pertinent surgical history.   reports that he has quit smoking. His smoking use included cigarettes. He has never used smokeless tobacco. He reports current alcohol use. He reports that he does not currently use drugs.  Allergies  Allergen Reactions   Fish Allergy Itching    Pt states he is allergic to lobster     No known family history of blood clots, or connective tissue disease.  Prior to Admission medications   Medication Sig Start Date End Date Taking? Authorizing Provider  amoxicillin -clavulanate (AUGMENTIN ) 875-125 MG tablet Take 1 tablet by mouth every 12 (twelve) hours. 01/03/24  Yes Hagler, Redell, MD  hydroxychloroquine  (PLAQUENIL ) 200 MG tablet Take 1 tablet by mouth daily. 02/13/23 02/13/24 Yes [provider]  mycophenolate  (CELLCEPT ) 500 MG tablet Take 1,000 mg by mouth 2 (two) times daily. 02/13/23  Yes [provider]  naproxen  (NAPROSYN ) 500 MG tablet Take 1 tablet (500 mg total) by mouth 2 (two) times daily. 01/01/24  Yes Rigney, Lonni D, PA-C  ondansetron  (ZOFRAN -ODT) 4 MG disintegrating tablet Take 1 tablet (4 mg total) by mouth every 8 (eight) hours as needed for nausea or vomiting. 01/03/24  Yes Rolinda Redell, MD    Physical Exam: Vitals:   01/06/24 1800 01/06/24 1830 01/06/24 1900 01/06/24 2000  BP: 133/87 126/83 124/83 110/71  Pulse: 70 69 81 71  Resp:   17 16  Temp:      SpO2: 99% 98% 99% 98%  Weight:      Height:        Constitutional: NAD, calm, comfortable Vitals:   01/06/24  1800 01/06/24 1830 01/06/24 1900 01/06/24 2000  BP: 133/87 126/83 124/83 110/71  Pulse: 70 69 81 71  Resp:   17 16  Temp:      SpO2: 99% 98% 99% 98%  Weight:      Height:       Eyes: PERRL, lids and conjunctivae normal ENMT: Mucous membranes are moist.  Neck: normal, supple, no masses, no thyromegaly Respiratory: clear to auscultation bilaterally, no wheezing, no crackles. Normal respiratory effort. No accessory muscle use.  Cardiovascular: Regular  rate and rhythm, no murmurs / rubs / gallops. No extremity edema.  Extremities warm Abdomen: no tenderness, no masses palpated. No hepatosplenomegaly. Bowel sounds positive.  Musculoskeletal: no clubbing / cyanosis. No joint deformity upper and lower extremities.  Skin: no rashes, lesions, ulcers. No induration Neurologic: No facial asymmetry, speech fluent, 5/5 strength in all extremities, sensation intact. Psychiatric: Normal judgment and insight. Alert and oriented x 3. Normal mood.   Labs on Admission: I have personally reviewed following labs and imaging studies  CBC: Recent Labs  Lab 01/01/24 0905 01/06/24 1236  WBC 10.4 8.4  NEUTROABS 7.3 6.0  HGB 14.6 13.7  HCT 42.6 41.2  MCV 85.0 87.7  PLT 184 284   Basic Metabolic Panel: Recent Labs  Lab 01/01/24 0905 01/06/24 1236  NA 135 137  K 3.9 4.4  CL 99 101  CO2 25 26  GLUCOSE 91 84  BUN 8 7  CREATININE 0.66 0.67  CALCIUM 9.3 9.1   GFR: Estimated Creatinine Clearance: 142.6 mL/min (by C-G formula based on SCr of 0.67 mg/dL). Liver Function Tests: Recent Labs  Lab 01/01/24 0905  AST 50*  ALT 43  ALKPHOS 78  BILITOT 0.9  PROT 8.9*  ALBUMIN  3.9   Urine analysis:    Component Value Date/Time   COLORURINE YELLOW 12/04/2021 1053   APPEARANCEUR CLEAR 12/04/2021 1053   LABSPEC 1.019 12/04/2021 1053   PHURINE 6.0 12/04/2021 1053   GLUCOSEU NEGATIVE 12/04/2021 1053   HGBUR NEGATIVE 12/04/2021 1053   BILIRUBINUR NEGATIVE 12/04/2021 1053   KETONESUR 80 (A) 12/04/2021 1053   PROTEINUR 100 (A) 12/04/2021 1053   NITRITE NEGATIVE 12/04/2021 1053   LEUKOCYTESUR NEGATIVE 12/04/2021 1053    Radiological Exams on Admission: MR MRV HEAD W WO CONTRAST Result Date: 01/06/2024 EXAM: MR Brain without and with Intravenous Contrast. CLINICAL HISTORY: Dural venous sinus thrombosis suspected. Migraine headache history. Severe pain in head for days, nausea and vomiting as well. TECHNIQUE: Magnetic resonance images of the brain  without and with intravenous contrast in multiple planes. CONTRAST: 7 mL Gadavist  (gadobutrol  (GADAVIST ) 1 MMOL/ML injection 7 mL GADOBUTROL  1 MMOL/ML IV SOLN). COMPARISON: None provided. FINDINGS: BRAIN: No restricted diffusion to indicate acute infarction. No intracranial mass or hemorrhage. No midline shift or extra-axial fluid collection. No cerebellar tonsillar ectopia. VENTRICLES: No hydrocephalus. ORBITS: The orbits are normal. SINUSES AND MASTOIDS: The sinuses and mastoid air cells are clear. BONES: No acute fracture or focal osseous lesion. VASCULATURE: No flow is present in the majority of the superior sagittal sinus and the dominant left transverse sinus. Left sigmoid sinus flow is present in the right transverse and sigmoid sinus. Cortical veins are patent. Anterior drainage pathways are patent bilaterally. IMPRESSION: 1. 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 Critical findings were called to Sherran Barks at 3:06 pm Electronically signed by: Lonni Necessary MD 01/06/2024 03:09 PM EDT RP Workstation: [id number]   MR BRAIN  WO CONTRAST Result Date: 01/06/2024 EXAM: MRI BRAIN WITHOUT CONTRAST 01/06/2024 01:53:49 PM TECHNIQUE: Multiplanar multisequence MRI of the head/brain was performed without the administration of intravenous contrast. COMPARISON: None available. CLINICAL HISTORY: Headache, increasing frequency or severity. MR Brain w/o; Migraine headache hx. Severe pain in head for days, nausea and vomiting as well. FINDINGS: BRAIN AND VENTRICLES: No acute infarct. No intracranial hemorrhage. No mass. No midline shift. No hydrocephalus. The sella is unremarkable. Abnormal signal is present in the superior sagittal sinus, left transverse sinus and left sigmoid sinus consistent with extensive dural sinus thrombosis. Cortical veins are patent. ORBITS: No acute abnormality. SINUSES AND MASTOIDS: A left  mastoid effusion is present. No obstructing nasopharyngeal lesion is present. BONES AND SOFT TISSUES: Normal marrow signal. No acute soft tissue abnormality. IMPRESSION: 1. Extensive dural sinus thrombosis involving the superior sagittal sinus, left transverse sinus, and left sigmoid sinus. Cortical veins are patent. 2. Left mastoid effusion. No obstructing nasopharyngeal lesion. Critical findings were called to Sherran Barks at 3:06 pm Electronically signed by: Lonni Necessary MD 01/06/2024 03:07 PM EDT RP Workstation: [id number]   EKG: None.   Assessment/Plan Principal Problem:   Dural venous sinus thrombosis Active Problems:   Mixed connective tissue disease (HCC)    Assessment and Plan: * Dural venous sinus thrombosis Patient with migraines nausea and vomiting with fatigue. No focal neurologic deficits.  MRI brain, MR MRV head- Dural venous sinus thrombosis involving the majority of the superior sagittal sinus and the dominant left transverse and sigmoid sinuses.  History of connective tissue disease.  No prior history of blood clots. -EDP talked to neurology, IV heparin  started, admit to [facility]  Mixed connective tissue disease [facility]) Per Care Everywhere manifestations-inflammatory arthritis, Raynaud's, myositis, NSIP-ILD, fatigue, weight loss, trace pleural and trivial pericardial effusions.  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 - Currently on mycophenolate  and Plaquenil  and reports compliance.  Follows with rheumatologist at [facility].  Acute suppurative otitis media diagnosed 9/10. - Complete course of Augmentin   DVT prophylaxis: Heparin  drip Code Status: FULL Family Communication: None at bedside Disposition Plan: ~ 2 days Consults called: Neurology  Admission status: Inpt tele  I certify that at the point of admission it is my clinical judgment that the patient will require inpatient hospital care spanning beyond 2 midnights from the point of admission  due to high intensity of service, high risk for further deterioration and high frequency of surveillance required.    Author: Tully FORBES Carwin, MD 01/06/2024 9:44 PM  For on call review [email or web address].

## 2024-01-06 NOTE — ED Notes (Signed)
 Pts mother given a recliner, pillow and warm blanket for comfort.

## 2024-01-06 NOTE — ED Notes (Signed)
 Patient moved to stretcher in the hallway, girlfriend sitting bedside and patient just returned from MRI.

## 2024-01-06 NOTE — ED Provider Notes (Signed)
 [facility] AT [facility] Provider Note   CSN: [id number] Arrival date & time: 01/06/24  1535     Patient presents with: Migraine   David [name] is a 23 y.o. male.  Patient is here after having eloped earlier today after his brain MRI for headache.  I called him back because he had extensive venous sinus thrombosis per MRV.  You can see my previous note for full history but essentially patient has had 3 weeks of persistent headache that has been getting worse, the time he gets that relief is when he is asleep.  He is associated with nausea and vomiting.  He denies any vision changes, denies seizure-like activity, denies numbness tingling or weakness.  States headache is severe and not like prior headaches he has had.  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 Patient is primarily worried because he is not feeling that he has some neck stiffness and is having increased pain in the occipital area that wraps around to the front of his head.      Migraine       Prior to Admission medications   Medication Sig Start Date End Date Taking? Authorizing Provider  amoxicillin -clavulanate (AUGMENTIN ) 875-125 MG tablet Take 1 tablet by mouth every 12 (twelve) hours. 01/03/24   Rolinda Rogue, MD  chlorhexidine  (HIBICLENS ) 4 % external liquid Apply topically daily as needed. 12/14/23   Stuart Vernell Norris, PA-C  ibuprofen  (ADVIL ) 600 MG tablet Take 1 tablet (600 mg total) by mouth every 6 (six) hours as needed for headache or mild pain. Always take with Food 12/05/21   Pearlean Manus, MD  mirtazapine  (REMERON ) 15 MG tablet Take 1 tablet (15 mg total) by mouth at bedtime. 12/05/21   Pearlean Manus, MD  mupirocin  ointment (BACTROBAN ) 2 % Apply 1 Application topically 2 (two) times daily. 12/14/23   Stuart Vernell Norris, PA-C  naproxen  (NAPROSYN ) 500 MG tablet Take 1 tablet (500 mg total) by mouth 2 (two) times daily. 01/01/24    Daralene Lonni BIRCH, PA-C  ondansetron  (ZOFRAN -ODT) 4 MG disintegrating tablet Take 1 tablet (4 mg total) by mouth every 8 (eight) hours as needed for nausea or vomiting. 01/03/24   Rolinda Rogue, MD  SUMAtriptan  (IMITREX ) 100 MG tablet Take 1 tablet (100 mg total) by mouth every 2 (two) hours as needed for migraine. May repeat in 2 hours if headache persists or recurs. Do not take more than 2 doses in 24 hours 03/21/23   Silver Wonda LABOR, PA    Allergies: Fish allergy    Review of Systems  Updated Vital Signs BP (!) 144/88 (BP Location: Right Arm)   Pulse 94   Temp 98 F (36.7 C)   Resp 17   Ht 5' 11 (1.803 m)   Wt 69 kg   SpO2 99%   BMI 21.22 kg/m   Physical Exam Vitals and nursing note reviewed.  Constitutional:      General: He is not in acute distress.    Appearance: He is well-developed.  HENT:     Head: Normocephalic and atraumatic.     Mouth/Throat:     Mouth: Mucous membranes are moist.  Eyes:     Conjunctiva/sclera: Conjunctivae normal.  Cardiovascular:     Rate and Rhythm: Normal rate and regular rhythm.     Heart sounds: No murmur heard. Pulmonary:     Effort: Pulmonary effort is normal. No respiratory distress.     Breath sounds:  Normal breath sounds.  Abdominal:     Palpations: Abdomen is soft.     Tenderness: There is no abdominal tenderness.  Musculoskeletal:        General: No swelling.     Cervical back: Neck supple.  Skin:    General: Skin is warm and dry.     Capillary Refill: Capillary refill takes less than 2 seconds.  Neurological:     General: No focal deficit present.     Mental Status: He is alert and oriented to person, place, and time.  Psychiatric:        Mood and Affect: Mood normal.     (all labs ordered are listed, but only abnormal results are displayed) Labs Reviewed - No data to display  EKG: None  Radiology: MR MRV HEAD W WO CONTRAST Result Date: 01/06/2024 EXAM: MR Brain without and with Intravenous Contrast.  CLINICAL HISTORY: Dural venous sinus thrombosis suspected. Migraine headache history. Severe pain in head for days, nausea and vomiting as well. TECHNIQUE: Magnetic resonance images of the brain without and with intravenous contrast in multiple planes. CONTRAST: 7 mL Gadavist  (gadobutrol  (GADAVIST ) 1 MMOL/ML injection 7 mL GADOBUTROL  1 MMOL/ML IV SOLN). COMPARISON: None provided. FINDINGS: BRAIN: No restricted diffusion to indicate acute infarction. No intracranial mass or hemorrhage. No midline shift or extra-axial fluid collection. No cerebellar tonsillar ectopia. VENTRICLES: No hydrocephalus. ORBITS: The orbits are normal. SINUSES AND MASTOIDS: The sinuses and mastoid air cells are clear. BONES: No acute fracture or focal osseous lesion. VASCULATURE: No flow is present in the majority of the superior sagittal sinus and the dominant left transverse sinus. Left sigmoid sinus flow is present in the right transverse and sigmoid sinus. Cortical veins are patent. Anterior drainage pathways are patent bilaterally. IMPRESSION: 1. 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 Critical findings were called to Sherran Barks at 3:06 pm Electronically signed by: Lonni Necessary MD 01/06/2024 03:09 PM EDT RP Workstation: [id number]   MR BRAIN WO CONTRAST Result Date: 01/06/2024 EXAM: MRI BRAIN WITHOUT CONTRAST 01/06/2024 01:53:49 PM TECHNIQUE: Multiplanar multisequence MRI of the head/brain was performed without the administration of intravenous contrast. COMPARISON: None available. CLINICAL HISTORY: Headache, increasing frequency or severity. MR Brain w/o; Migraine headache hx. Severe pain in head for days, nausea and vomiting as well. FINDINGS: BRAIN AND VENTRICLES: No acute infarct. No intracranial hemorrhage. No mass. No midline shift. No hydrocephalus. The sella is unremarkable. Abnormal signal is present in the superior  sagittal sinus, left transverse sinus and left sigmoid sinus consistent with extensive dural sinus thrombosis. Cortical veins are patent. ORBITS: No acute abnormality. SINUSES AND MASTOIDS: A left mastoid effusion is present. No obstructing nasopharyngeal lesion is present. BONES AND SOFT TISSUES: Normal marrow signal. No acute soft tissue abnormality. IMPRESSION: 1. Extensive dural sinus thrombosis involving the superior sagittal sinus, left transverse sinus, and left sigmoid sinus. Cortical veins are patent. 2. Left mastoid effusion. No obstructing nasopharyngeal lesion. Critical findings were called to Sherran Barks at 3:06 pm Electronically signed by: Lonni Necessary MD 01/06/2024 03:07 PM EDT RP Workstation: [id number]     .Critical Care  Performed by: Barks Sherran LABOR, PA-C Authorized by: Barks Sherran LABOR, PA-C   Critical care provider statement:    Critical care time (minutes):  30   Critical care time was exclusive of:  Teaching time and separately billable procedures and treating other patients   Critical care was necessary to treat or  prevent imminent or life-threatening deterioration of the following conditions: Venous sinus thrombosis with large clot burden requiring heparin  drip.   Critical care was time spent personally by me on the following activities:  Development of treatment plan with patient or surrogate, discussions with consultants, evaluation of patient's response to treatment, examination of patient, ordering and review of laboratory studies, ordering and review of radiographic studies, ordering and performing treatments and interventions, pulse oximetry, re-evaluation of patient's condition and review of old charts   Care discussed with: admitting provider     Care discussed with comment:  Dr. Michaela    Medications Ordered in the ED - No data to display                                  Medical Decision Making This patient presents to the ED for concern of  leg lasting 3 weeks, had a visit where he had just eloped prior to MRI results showing cerebral venous sinus thrombosis this involves an extensive number of treatment options, and is a complaint that carries with it a high risk of complications and morbidity.     Co morbidities that complicate the patient evaluation : Mixed connective tissue disorder   Additional history obtained:  Additional history obtained from EMR External records from outside source obtained and reviewed including notes and labs   Lab Tests:  I Ordered, and personally interpreted labs.  The pertinent results include: CBC normal, BMP normal   Imaging Studies ordered:  MRI was performed at previous visit with MRV    Consultations Obtained:  I requested consultation with the neurologist Dr. Michaela,  and discussed lab and imaging findings as well as pertinent plan - they recommend: Initiate heparin  and admit to hospitalist to [facility]   Problem List / ED Course / Critical interventions / Medication management  Cerebral venous sinus thrombosis-patient has been having headache for the past 3 weeks and has been getting worse its constant and associate with nausea and vomiting he is now having some stiffness in his neck though no fevers, new numbness tingling or weakness.  No trouble with his balance or vision.  He is overall nontoxic appearance but does have some stiffness with neck flexion.  He has no other neurologic findings.  Given his makes connective tissue disorder I had ordered the MRI with MRV for further evaluation of this headache, specifically with concern for venous sinus thrombosis given the persistence, vomiting and his autoimmune disorder increasing likelihood of hypercoagulability.  His MRI had showed large amount of clot, and he had eloped, he came immediately back to the ER after receiving phone call from me about his results.  He is initiated on heparin , neurology consulted as above and will  plan on admission.  His headache had improved with the pain and Benadryl  from previous visit he states he has only mild pain at this time.  Will admit to hospitalist to [facility].  Patient was reevaluated several times, he is not having any adverse reactions to the heparin , headache is well-controlled.  No change in his neurologic status.   Discussed with Dr. Pearlean, [facility]  hospitalist who will see patient.    Amount and/or Complexity of Data Reviewed Labs: ordered.  Risk Prescription drug management. Decision regarding hospitalization.        Final diagnoses:  None    ED Discharge Orders     None  Suellen Sherran DELENA DEVONNA 01/06/24 1944    Suzette Pac, MD 01/08/24 1302

## 2024-01-07 DIAGNOSIS — M351 Other overlap syndromes: Secondary | ICD-10-CM | POA: Diagnosis not present

## 2024-01-07 DIAGNOSIS — G08 Intracranial and intraspinal phlebitis and thrombophlebitis: Secondary | ICD-10-CM | POA: Diagnosis not present

## 2024-01-07 LAB — CBC
HCT: 38.4 % — ABNORMAL LOW (ref 39.0–52.0)
Hemoglobin: 13.1 g/dL (ref 13.0–17.0)
MCH: 29.2 pg (ref 26.0–34.0)
MCHC: 34.1 g/dL (ref 30.0–36.0)
MCV: 85.5 fL (ref 80.0–100.0)
Platelets: 272 K/uL (ref 150–400)
RBC: 4.49 MIL/uL (ref 4.22–5.81)
RDW: 13.6 % (ref 11.5–15.5)
WBC: 8.5 K/uL (ref 4.0–10.5)
nRBC: 0 % (ref 0.0–0.2)

## 2024-01-07 LAB — BASIC METABOLIC PANEL WITH GFR
Anion gap: 10 (ref 5–15)
BUN: 7 mg/dL (ref 6–20)
CO2: 25 mmol/L (ref 22–32)
Calcium: 8.8 mg/dL — ABNORMAL LOW (ref 8.9–10.3)
Chloride: 100 mmol/L (ref 98–111)
Creatinine, Ser: 0.61 mg/dL (ref 0.61–1.24)
GFR, Estimated: >60 mL/min (ref >60)
Glucose, Bld: 113 mg/dL — ABNORMAL HIGH (ref 70–99)
Potassium: 3.9 mmol/L (ref 3.5–5.1)
Sodium: 135 mmol/L (ref 135–145)

## 2024-01-07 LAB — HEPARIN LEVEL (UNFRACTIONATED)
Heparin Unfractionated: 0.31 [IU]/mL (ref 0.30–0.70)
Heparin Unfractionated: 0.44 [IU]/mL (ref 0.30–0.70)

## 2024-01-07 LAB — HIV ANTIBODY (ROUTINE TESTING W REFLEX): HIV Screen 4th Generation wRfx: NONREACTIVE

## 2024-01-07 MED ORDER — SODIUM CHLORIDE 0.9 % IV SOLN: INTRAVENOUS | Status: AC

## 2024-01-07 MED ORDER — HYDROCODONE-ACETAMINOPHEN 5-325 MG PO TABS
1.0000 | ORAL_TABLET | Freq: Four times a day (QID) | ORAL | Status: DC | PRN
Start: 2024-01-07 — End: 2024-01-10
  Administered 2024-01-07 – 2024-01-10 (×7): 1 via ORAL
  Filled 2024-01-07 (×7): qty 1

## 2024-01-07 MED ORDER — BUTALBITAL-APAP-CAFFEINE 50-325-40 MG PO TABS
1.0000 | ORAL_TABLET | Freq: Four times a day (QID) | ORAL | Status: AC | PRN
  Administered 2024-01-08 (×2): 1 via ORAL
  Filled 2024-01-07 (×3): qty 1

## 2024-01-07 NOTE — Progress Notes (Addendum)
 PHARMACY - ANTICOAGULATION CONSULT NOTE  Pharmacy Consult for heparin  Indication: dural venous sinus thrombosis  Allergies  Allergen Reactions   Fish Allergy Itching    Pt states he is allergic to lobster     Patient Measurements: Height: 5' 11 (180.3 cm) Weight: 69 kg (152 lb 1.9 oz) IBW/kg (Calculated) : 75.3 HEPARIN  DW (KG): 69  Vital Signs: Temp: 97.4 F (36.3 C) (09/14 1002) Temp Source: Oral (09/14 1002) BP: 110/73 (09/14 1002) Pulse Rate: 73 (09/14 1002)  Labs: Recent Labs    01/06/24 1236 01/06/24 2319 01/07/24 0803  HGB 13.7  --  13.1  HCT 41.2  --  38.4*  PLT 284  --  272  HEPARINUNFRC  --  0.19* 0.31  CREATININE 0.67  --  0.61    Estimated Creatinine Clearance: 142.6 mL/min (by C-G formula based on SCr of 0.61 mg/dL).   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 New orders to start IV heparin . Not on anticoagulants prior to admission.   Heparin  at goal this morning at 0.31 on 1300 units/hr. No bleeding issues noted.   Goal of Therapy:  Heparin  level 0.3-0.5 units/ml  Monitor platelets by anticoagulation protocol: Yes   Plan:  Continue heparin  infusion at 1350 units/hr to keep within goal Check anti-Xa level in 6-8 hours and daily while on heparin  Continue to monitor H&H and platelets  Dempsey Blush PharmD., BCPS Clinical Pharmacist 01/07/2024 11:24 AM

## 2024-01-07 NOTE — Progress Notes (Addendum)
 PROGRESS NOTE   David [name]  FMW:[id number] DOB: Jan 10, 2001 DOA: 01/06/2024 PCP: Bucio, Elsa C, FNP   Chief Complaint  Patient presents with   Migraine   Level of care: Telemetry Medical  Brief Admission History:  23 y.o. male with medical history significant for mixed connective tissue disease.  Patient presented to the ED with complaints of ongoing headaches of 3 weeks duration.  Reports fatigue nausea and vomiting with headaches. Headaches feel like pressure around the base of the skull, with neck stiffness.  On ED 9/18 and exam resolved in the ED, and urgent care 9/10 was diagnosed with acute suppurative otitis media of the left ear based on his cold symptoms.  He was started on a course of Augmentin .  Weakness of his extremities, no facial asymmetry, no speech changes.  He denies history of blood clots.  No pain or swelling swelling of lower extremities.  MR brain without contrast- Extensive dural sinus thrombosis involving the superior sagittal sinus, left transverse sinus, and left sigmoid sinus. Cortical veins are patent. Left mastoid effusion. No obstructing nasopharyngeal lesion. 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   Patient initially presented to the ED earlier in the day, and eloped from the ED, because he got tired of waiting and his headache had improved with conservative management in the ED. When MRI resulted, he was called to come back to the ED.   EDP talked to neurologist on-call, Dr. Michaela, commended admission to [facility].  Heparin  drip.   Assessment and Plan:  Extensive Dural venous sinus thrombosis Chronic severe headaches Patient with migraines nausea and vomiting with fatigue. No focal neurologic deficits.  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  History of connective tissue disease.  No prior history of blood clots. -EDP talked to neurologist Dr. Michaela who requested admission to [facility], IV heparin  started, admit to [facility] when bed available -continue IV heparin  infusion managed by pharm D  Mixed connective tissue disease  Per Care Everywhere manifestations-inflammatory arthritis, Raynaud's, myositis, NSIP-ILD, fatigue, weight loss, trace pleural and trivial pericardial effusions.  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 - Currently on mycophenolate  and Plaquenil  and reports compliance.  Follows with rheumatologist at [facility].  DVT prophylaxis: IV heparin  infusion Code Status: Full  Family Communication: mother at bedside updated  Disposition: transfer to [facility] when bed available    Consultants:  Neurology at [facility]  Procedures:   Antimicrobials:    Subjective: Pt says the headaches are less intense but still present.   Objective: Vitals:   01/07/24 0945 01/07/24 1000 01/07/24 1002 01/07/24 1200  BP:  110/73 110/73 (!) 141/90  Pulse: 73  73 73  Resp:   18   Temp:   (!) 97.4 F (36.3 C)   TempSrc:   Oral   SpO2: 100%  100% 100%  Weight:      Height:        Intake/Output Summary (Last 24 hours) at 01/07/2024 1239 Last data filed at 01/07/2024 9357 Gross per 24 hour  Intake 207.89 ml  Output --  Net 207.89 ml   Filed Weights   01/06/24 1543  Weight: 69 kg   Examination:  General exam: Appears calm and comfortable  Respiratory system: Clear to auscultation. Respiratory effort normal. Cardiovascular system: normal  S1 & S2 heard. No JVD, murmurs, rubs, gallops or clicks. No pedal edema. Gastrointestinal system: Abdomen is nondistended, soft and nontender. No organomegaly or masses felt. Normal bowel sounds heard. Central nervous system: Alert and oriented. No focal neurological deficits. Extremities: Symmetric 5 x 5 power. Skin: No rashes, lesions or ulcers. Psychiatry:  Judgement and insight appear normal. Mood & affect appropriate.   Data Reviewed: I have personally reviewed following labs and imaging studies  CBC: Recent Labs  Lab 01/01/24 0905 01/06/24 1236 01/07/24 0803  WBC 10.4 8.4 8.5  NEUTROABS 7.3 6.0  --   HGB 14.6 13.7 13.1  HCT 42.6 41.2 38.4*  MCV 85.0 87.7 85.5  PLT 184 284 272    Basic Metabolic Panel: Recent Labs  Lab 01/01/24 0905 01/06/24 1236 01/07/24 0803  NA 135 137 135  K 3.9 4.4 3.9  CL 99 101 100  CO2 25 26 25   GLUCOSE 91 84 113*  BUN 8 7 7   CREATININE 0.66 0.67 0.61  CALCIUM 9.3 9.1 8.8*    CBG: No results for input(s): GLUCAP in the last 168 hours.  Recent Results (from the past 240 hours)  Resp panel by RT-PCR (RSV, Flu A&B, Covid) Anterior Nasal Swab     Status: None   Collection Time: 01/01/24  8:40 AM   Specimen: Anterior Nasal Swab  Result Value Ref Range Status   SARS Coronavirus 2 by RT PCR NEGATIVE NEGATIVE Final    Comment: (NOTE) SARS-CoV-2 target nucleic acids are NOT DETECTED.  The SARS-CoV-2 RNA is generally detectable in upper respiratory specimens during the acute phase of infection. The lowest concentration of SARS-CoV-2 viral copies this assay can detect is 138 copies/mL. A negative result does not preclude SARS-Cov-2 infection and should not be used as the sole basis for treatment or other patient management decisions. A negative result may occur with  improper specimen collection/handling, submission of specimen other than nasopharyngeal swab, presence of viral mutation(s) within the areas targeted by this assay, and inadequate number of viral copies(<138 copies/mL). A negative result must be combined with clinical observations, patient history, and epidemiological information. The expected result is Negative.  Fact Sheet for Patients:  BloggerCourse.com  Fact Sheet for Healthcare Providers:  SeriousBroker.it  This test is  no t yet approved or cleared by the [place]  FDA and  has been authorized for detection and/or diagnosis of SARS-CoV-2 by FDA under an Emergency Use Authorization (EUA). This EUA will remain  in effect (meaning this test can be used) for the duration of the COVID-19 declaration under Section 564(b)(1) of the Act, 21 U.S.C.section 360bbb-3(b)(1), unless the authorization is terminated  or revoked sooner.       Influenza A by PCR NEGATIVE NEGATIVE Final   Influenza B by PCR NEGATIVE NEGATIVE Final    Comment: (NOTE) The Xpert Xpress SARS-CoV-2/FLU/RSV plus assay is intended as an aid in the diagnosis of influenza from Nasopharyngeal swab specimens and should not be used as a sole basis for treatment. Nasal washings and aspirates are unacceptable for Xpert Xpress SARS-CoV-2/FLU/RSV testing.  Fact Sheet for Patients: BloggerCourse.com  Fact Sheet for Healthcare Providers: SeriousBroker.it  This test is not yet approved or cleared by the [place]  FDA and has been authorized for detection and/or diagnosis of SARS-CoV-2 by FDA under an Emergency Use Authorization (EUA). This EUA will remain in effect (meaning this test can be used) for the duration of the COVID-19 declaration under Section 564(b)(1) of the Act, 21 U.S.C. section 360bbb-3(b)(1),  unless the authorization is terminated or revoked.     Resp Syncytial Virus by PCR NEGATIVE NEGATIVE Final    Comment: (NOTE) Fact Sheet for Patients: BloggerCourse.com  Fact Sheet for Healthcare Providers: SeriousBroker.it  This test is not yet approved or cleared by the [place]  FDA and has been authorized for detection and/or diagnosis of SARS-CoV-2 by FDA under an Emergency Use Authorization (EUA). This EUA will remain in effect (meaning this test can be used) for the duration of the COVID-19 declaration under Section  564(b)(1) of the Act, 21 U.S.C. section 360bbb-3(b)(1), unless the authorization is terminated or revoked.  Performed at [facility], [address], [place], [place] [address]   Group A Strep by PCR     Status: None   Collection Time: 01/01/24  8:40 AM   Specimen: Anterior Nasal Swab; Sterile Swab  Result Value Ref Range Status   Group A Strep by PCR NOT DETECTED NOT DETECTED Final    Comment: Performed at [facility], [address] [place], [place] [address]     Radiology Studies: MR MRV HEAD W WO CONTRAST Result Date: 01/06/2024 EXAM: MR Brain without and with Intravenous Contrast. CLINICAL HISTORY: Dural venous sinus thrombosis suspected. Migraine headache history. Severe pain in head for days, nausea and vomiting as well. TECHNIQUE: Magnetic resonance images of the brain without and with intravenous contrast in multiple planes. CONTRAST: 7 mL Gadavist  (gadobutrol  (GADAVIST ) 1 MMOL/ML injection 7 mL GADOBUTROL  1 MMOL/ML IV SOLN). COMPARISON: None provided. FINDINGS: BRAIN: No restricted diffusion to indicate acute infarction. No intracranial mass or hemorrhage. No midline shift or extra-axial fluid collection. No cerebellar tonsillar ectopia. VENTRICLES: No hydrocephalus. ORBITS: The orbits are normal. SINUSES AND MASTOIDS: The sinuses and mastoid air cells are clear. BONES: No acute fracture or focal osseous lesion. VASCULATURE: No flow is present in the majority of the superior sagittal sinus and the dominant left transverse sinus. Left sigmoid sinus flow is present in the right transverse and sigmoid sinus. Cortical veins are patent. Anterior drainage pathways are patent bilaterally. IMPRESSION: 1. 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 Critical findings were called to Sherran Barks at 3:06 pm Electronically signed by: Lonni Necessary MD 01/06/2024 03:09 PM EDT RP Workstation: [id number]    MR BRAIN WO CONTRAST Result Date: 01/06/2024 EXAM: MRI BRAIN WITHOUT CONTRAST 01/06/2024 01:53:49 PM TECHNIQUE: Multiplanar multisequence MRI of the head/brain was performed without the administration of intravenous contrast. COMPARISON: None available. CLINICAL HISTORY: Headache, increasing frequency or severity. MR Brain w/o; Migraine headache hx. Severe pain in head for days, nausea and vomiting as well. FINDINGS: BRAIN AND VENTRICLES: No acute infarct. No intracranial hemorrhage. No mass. No midline shift. No hydrocephalus. The sella is unremarkable. Abnormal signal is present in the superior sagittal sinus, left transverse sinus and left sigmoid sinus consistent with extensive dural sinus thrombosis. Cortical veins are patent. ORBITS: No acute abnormality. SINUSES AND MASTOIDS: A left mastoid effusion is present. No obstructing nasopharyngeal lesion is present. BONES AND SOFT TISSUES: Normal marrow signal. No acute soft tissue abnormality. IMPRESSION: 1. Extensive dural sinus thrombosis involving the superior sagittal sinus, left transverse sinus, and left sigmoid sinus. Cortical veins are patent. 2. Left mastoid effusion. No obstructing nasopharyngeal lesion. Critical findings were called to Sherran Barks at 3:06 pm Electronically signed by: Lonni Necessary MD 01/06/2024 03:07 PM EDT RP Workstation: [id number]    Scheduled Meds:  amoxicillin -clavulanate  1 tablet Oral Q12H  hydroxychloroquine   200 mg Oral Daily   mycophenolate   1,000 mg Oral BID   Continuous Infusions:  heparin  1,350 Units/hr (01/07/24 1159)    LOS: 1 day   Time spent: 55 mins  Markitta Ausburn Vicci, MD How to contact the [facility] Attending or Consulting provider 7A - 7P or covering provider during after hours 7P -7A, for this patient?  Check the care team in [facility] and look for a) attending/consulting [facility] provider listed and b) the [facility] team listed Log into [email or web address] to find provider on call.  Locate the [facility] provider you  are looking for under [facility] and page to a number that you can be directly reached. If you still have difficulty reaching the provider, please page the [facility] (Director on Call) for the Hospitalists listed on amion for assistance.  01/07/2024, 12:39 PM

## 2024-01-07 NOTE — ED Notes (Signed)
 Mother contacted to be made aware of patients transport to [facility] via carelink.

## 2024-01-07 NOTE — Progress Notes (Signed)
 PHARMACY - ANTICOAGULATION CONSULT NOTE  Pharmacy Consult for heparin  Indication: dural venous sinus thrombosis  Allergies  Allergen Reactions   Fish Allergy Itching    Pt states he is allergic to lobster     Patient Measurements: Height: 5' 11 (180.3 cm) Weight: 69 kg (152 lb 1.9 oz) IBW/kg (Calculated) : 75.3 HEPARIN  DW (KG): 69  Vital Signs: Temp: 98.5 F (36.9 C) (09/14 1300) Temp Source: Oral (09/14 1300) BP: 150/95 (09/14 1510) Pulse Rate: 78 (09/14 1510)  Labs: Recent Labs    01/06/24 1236 01/06/24 2319 01/07/24 0803 01/07/24 1807  HGB 13.7  --  13.1  --   HCT 41.2  --  38.4*  --   PLT 284  --  272  --   HEPARINUNFRC  --  0.19* 0.31 0.44  CREATININE 0.67  --  0.61  --     Estimated Creatinine Clearance: 142.6 mL/min (by C-G formula based on SCr of 0.61 mg/dL).   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 New orders to start IV heparin . Not on anticoagulants prior to admission.   Heparin  at goal this morning at 0.31 on 1300 units/hr. No bleeding issues noted.   PM update: HL therapeutic at 0.44 on heparin  1350 units/hr. No issues with the infusion or bleeding reported.  Goal of Therapy:  Heparin  level 0.3-0.5 units/ml  Monitor platelets by anticoagulation protocol: Yes   Plan:  Continue heparin  infusion at 1350 units/hr  Check anti-Xa level daily while on heparin  Continue to monitor H&H and platelets  Rocky Slade, PharmD, BCPS Clinical Pharmacist 01/07/2024 7:14 PM  Please check AMION for all [facility] Pharmacy phone numbers After 10:00 PM, call Main Pharmacy [phone]

## 2024-01-07 NOTE — Hospital Course (Signed)
 23 y.o. male with medical history significant for mixed connective tissue disease.  Patient presented to the ED with complaints of ongoing headaches of 3 weeks duration.  Reports fatigue nausea and vomiting with headaches. Headaches feel like pressure around the base of the skull, with neck stiffness.  On ED 9/18 and exam resolved in the ED, and urgent care 9/10 was diagnosed with acute suppurative otitis media of the left ear based on his cold symptoms.  He was started on a course of Augmentin .  Weakness of his extremities, no facial asymmetry, no speech changes.  He denies history of blood clots.  No pain or swelling swelling of lower extremities.  MR brain without contrast- Extensive dural sinus thrombosis involving the superior sagittal sinus, left transverse sinus, and left sigmoid sinus. Cortical veins are patent. Left mastoid effusion. No obstructing nasopharyngeal lesion. 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   Patient initially presented to the ED earlier in the day, and eloped from the ED, because he got tired of waiting and his headache had improved with conservative management in the ED. When MRI resulted, he was called to come back to the ED.   EDP talked to neurologist on-call, Dr. Michaela, commended admission to [facility].  Heparin  drip.

## 2024-01-07 NOTE — ED Notes (Signed)
 Report given to Hansell, RN on receiving unit on 3W at [facility]. No further questions at this time.

## 2024-01-07 NOTE — ED Notes (Signed)
 Pt called out reporting bilateral temporal head pain. Rating 9/10. Denies any additional s/s. PRN PO tylenol  given. Primary RN Maci updated. Pts mother Rosealee remains at bedside.

## 2024-01-07 NOTE — Consult Note (Signed)
 NEUROLOGY CONSULT NOTE   Date of service: January 07, 2024 Patient Name: David [name] MRN:  [id number] DOB:  2001/03/30 Chief Complaint: Dural venous sinus thrombus Requesting Provider: Vicci Afton CROME, MD  History of Present Illness  David [name] is a 23 y.o. male with hx of mixed connective tissue disorder presenting with 3 weeks of headache. He was seen at urgent care on 9/10 and was diagnosed with otitis media of the left ear and started on Augmentin .  He continued to have headache around the base of the skull and neck stiffness.  MRI and MRV showed a dural venous sinus thrombosis involving the majority of the superior sagittal sinus and left transverse and sigmoid sinuses.  He initially left the hospital because he got tired of waiting and felt like his headache got better under conservative management, after MRI resulted he was called and told to return.   He was transferred from [facility] to [facility] and started on a heparin  drip.  He did receive benadryl  and compazine  in the ED.     ROS  Comprehensive ROS performed and pertinent positives documented in HPI   Past History   Past Medical History:  Diagnosis Date   Lung disease    MCTD (mixed connective tissue disease) (HCC)     History reviewed. No pertinent surgical history.  Family History: History reviewed. No pertinent family history.  Social History  reports that he has quit smoking. His smoking use included cigarettes. He has never used smokeless tobacco. He reports current alcohol use. He reports that he does not currently use drugs.  Allergies  Allergen Reactions   Fish Allergy Itching    Pt states he is allergic to lobster     Medications   Current Facility-Administered Medications:    acetaminophen  (TYLENOL ) tablet 650 mg, 650 mg, Oral, Q6H PRN, 650 mg at 01/07/24 1010 **OR** acetaminophen  (TYLENOL ) suppository 650 mg, 650 mg, Rectal, Q6H PRN, Emokpae, Ejiroghene E, MD    amoxicillin -clavulanate (AUGMENTIN ) 875-125 MG per tablet 1 tablet, 1 tablet, Oral, Q12H, Emokpae, Ejiroghene E, MD, 1 tablet at 01/07/24 0906   heparin  ADULT infusion 100 units/mL (25000 units/250mL), 1,350 Units/hr, Intravenous, Continuous, Tanda Dempsey SAUNDERS, RPH, Last Rate: 13.5 mL/hr at 01/07/24 1159, 1,350 Units/hr at 01/07/24 1159   hydroxychloroquine  (PLAQUENIL ) tablet 200 mg, 200 mg, Oral, Daily, Emokpae, Ejiroghene E, MD, 200 mg at 01/07/24 0932   mycophenolate  (CELLCEPT ) capsule 1,000 mg, 1,000 mg, Oral, BID, Emokpae, Ejiroghene E, MD, 1,000 mg at 01/07/24 0931   ondansetron  (ZOFRAN -ODT) disintegrating tablet 4 mg, 4 mg, Oral, Q8H PRN, Emokpae, Ejiroghene E, MD   polyethylene glycol (MIRALAX  / GLYCOLAX ) packet 17 g, 17 g, Oral, Daily PRN, Emokpae, Ejiroghene E, MD  Vitals   Vitals:   01/07/24 1002 01/07/24 1200 01/07/24 1300 01/07/24 1400  BP: 110/73 (!) 141/90 130/89 131/84  Pulse: 73 73 79   Resp: 18  18   Temp: (!) 97.4 F (36.3 C)  98.5 F (36.9 C)   TempSrc: Oral  Oral   SpO2: 100% 100% 100%   Weight:      Height:        Body mass index is 21.22 kg/m.   Physical Exam   Constitutional: Appears well-developed and well-nourished.  Psych: Affect appropriate to situation.  Eyes: No scleral injection.  HENT: No OP obstruction.  Head: Normocephalic.  Cardiovascular: Normal rate and regular rhythm.  Respiratory: Effort normal, non-labored breathing.  GI: Soft.  No distension. There is no tenderness.  Skin: WDI.   Neurologic Examination    Neuro: Mental Status: Patient is awake, alert, oriented to person, place, month, year, and situation. Patient is able to give a clear and coherent history. No signs of aphasia or neglect Cranial Nerves: II: Visual Fields are full. Pupils are equal, round, and reactive to light.   III,IV, VI: EOMI without ptosis or diploplia.  V: Facial sensation is symmetric to temperature VII: Facial movement is symmetric resting and  smiling VIII: Hearing is intact to voice X: Palate elevates symmetrically XI: Shoulder shrug is symmetric. XII: Tongue protrudes midline without atrophy or fasciculations.  Motor: Tone is normal. Bulk is normal. 5/5 strength was present in all four extremities.  Sensory: Sensation is symmetric to light touch and temperature in the arms and legs. No extinction to DSS present.  Cerebellar: FNF and HKS are intact bilaterally  Labs/Imaging/Neurodiagnostic studies   CBC:  Recent Labs  Lab 01/07/24 0905 01/06/24 1236 01/07/24 0803  WBC 10.4 8.4 8.5  NEUTROABS 7.3 6.0  --   HGB 14.6 13.7 13.1  HCT 42.6 41.2 38.4*  MCV 85.0 87.7 85.5  PLT 184 284 272   Basic Metabolic Panel:  Lab Results  Component Value Date   NA 135 01/07/2024   K 3.9 01/07/2024   CO2 25 01/07/2024   GLUCOSE 113 (H) 01/07/2024   BUN 7 01/07/2024   CREATININE 0.61 01/07/2024   CALCIUM 8.8 (L) 01/07/2024   GFRNONAA >60 01/07/2024   Lipid Panel: No results found for: LDLCALC HgbA1c: No results found for: HGBA1C Urine Drug Screen: No results found for: LABOPIA, COCAINSCRNUR, LABBENZ, AMPHETMU, THCU, LABBARB  Alcohol Level No results found for: [facility] INR  Lab Results  Component Value Date   INR 1.4 (H) 12/04/2021   APTT  Lab Results  Component Value Date   APTT 35 12/04/2021   MRV (Personally reviewed): Dural venous sinus thrombosis involving the majority of the superior sagittal sinus and the dominant left transverse and sigmoid sinuses.  MRI Brain(Personally reviewed): Extensive dural sinus thrombosis involving the superior sagittal sinus, left transverse sinus, and left sigmoid sinus. Cortical veins are patent. Left mastoid effusion. No obstructing nasopharyngeal lesion.  ASSESSMENT   David [name] is a 23 y.o. male hx of mixed connective tissue disorder presenting with 3 weeks of headache. He was seen at urgent care on 9/10 and was diagnosed with otitis media of the left ear and  started on Augmentin .  He continued to have headache around the base of the skull and neck stiffness.  MRI and MRV showed a dural venous sinus thrombosis involving the majority of the superior sagittal sinus and left transverse and sigmoid sinuses.  He initially left the hospital because he got tired of waiting and felt like his headache got better under conservative management, after MRI resulted he was called and told to return.   He was transferred [facility] and started on a heparin  drip.  He did receive benadryl  and compazine  in the ED. Will need anticoagulation, currently on heparin  IV and normal saline. Hypercoagulable work up ordered. Stroke team to follow tomorrow    RECOMMENDATIONS  - Continue heparin  IV in acute phase. After a couple days, can be switched to DOACs. - Normal saline 100ml/hr - Hypercoagulable work up - Venous duplex - Echocardiogram - Stroke team to follow tomorrow  ______________________________________________________________________    Signed, Jorene Last, NP [facility]   NEUROHOSPITALIST ADDENDUM Performed a face to face diagnostic evaluation.  I have reviewed the contents of history and physical exam as documented by PA/ARNP/Resident and agree with above documentation.  I have discussed and formulated the above plan as documented. Edits to the note have been made as needed.   Vannak Montenegro, MD [facility] [phone]   If 7pm to 7am, please call on call as listed on AMION.

## 2024-01-08 ENCOUNTER — Inpatient Hospital Stay (HOSPITAL_COMMUNITY)

## 2024-01-08 DIAGNOSIS — M7989 Other specified soft tissue disorders: Secondary | ICD-10-CM | POA: Diagnosis not present

## 2024-01-08 DIAGNOSIS — D6859 Other primary thrombophilia: Secondary | ICD-10-CM

## 2024-01-08 DIAGNOSIS — M351 Other overlap syndromes: Secondary | ICD-10-CM | POA: Diagnosis not present

## 2024-01-08 DIAGNOSIS — M359 Systemic involvement of connective tissue, unspecified: Secondary | ICD-10-CM | POA: Diagnosis not present

## 2024-01-08 DIAGNOSIS — G08 Intracranial and intraspinal phlebitis and thrombophlebitis: Secondary | ICD-10-CM | POA: Diagnosis not present

## 2024-01-08 DIAGNOSIS — E860 Dehydration: Secondary | ICD-10-CM | POA: Diagnosis not present

## 2024-01-08 LAB — SEDIMENTATION RATE: Sed Rate: 70 mm/h — ABNORMAL HIGH (ref 0–16)

## 2024-01-08 LAB — CBC
HCT: 40.1 % (ref 39.0–52.0)
Hemoglobin: 13.5 g/dL (ref 13.0–17.0)
MCH: 28.5 pg (ref 26.0–34.0)
MCHC: 33.7 g/dL (ref 30.0–36.0)
MCV: 84.8 fL (ref 80.0–100.0)
Platelets: 285 K/uL (ref 150–400)
RBC: 4.73 MIL/uL (ref 4.22–5.81)
RDW: 13.2 % (ref 11.5–15.5)
WBC: 9.7 K/uL (ref 4.0–10.5)
nRBC: 0 % (ref 0.0–0.2)

## 2024-01-08 LAB — RAPID URINE DRUG SCREEN, HOSP PERFORMED
Amphetamines: NOT DETECTED
Barbiturates: POSITIVE — AB
Benzodiazepines: NOT DETECTED
Cocaine: NOT DETECTED
Opiates: NOT DETECTED
Tetrahydrocannabinol: NOT DETECTED

## 2024-01-08 LAB — ECHOCARDIOGRAM COMPLETE BUBBLE STUDY
Area-P 1/2: 3.68 cm2
Calc EF: 63.6 %
S' Lateral: 2.5 cm
Single Plane A2C EF: 59.3 %
Single Plane A4C EF: 66.8 %

## 2024-01-08 LAB — ANTITHROMBIN III: AntiThromb III Func: 88 % (ref 75–120)

## 2024-01-08 LAB — HEPARIN LEVEL (UNFRACTIONATED): Heparin Unfractionated: 0.43 [IU]/mL (ref 0.30–0.70)

## 2024-01-08 MED ORDER — BUTALBITAL-APAP-CAFFEINE 50-325-40 MG PO TABS
1.0000 | ORAL_TABLET | Freq: Four times a day (QID) | ORAL | Status: DC | PRN
  Administered 2024-01-08 – 2024-01-10 (×5): 1 via ORAL
  Filled 2024-01-08 (×5): qty 1

## 2024-01-08 MED ORDER — IOHEXOL 350 MG/ML SOLN
60.0000 mL | Freq: Once | INTRAVENOUS | Status: AC | PRN
  Administered 2024-01-08: 60 mL via INTRAVENOUS

## 2024-01-08 MED ORDER — MYCOPHENOLATE MOFETIL 250 MG PO CAPS
1000.0000 mg | ORAL_CAPSULE | Freq: Two times a day (BID) | ORAL | Status: DC
  Administered 2024-01-08 – 2024-01-10 (×5): 1000 mg via ORAL
  Filled 2024-01-08 (×5): qty 4

## 2024-01-08 MED ORDER — HYDRALAZINE HCL 20 MG/ML IJ SOLN: 10.0000 mg | Freq: Four times a day (QID) | INTRAMUSCULAR | Status: DC | PRN

## 2024-01-08 NOTE — Progress Notes (Addendum)
 STROKE TEAM PROGRESS NOTE   INTERIM HISTORY/SUBJECTIVE Patient presenting after 3 weeks of headache . He is seen at urgent care on 9/10 and was diagnosed with otitis media of the left ear and started on Augmentin .  He continued to have headache around the base of the skull and neck stiffness.  MRI and MRV showed a dural venous sinus thrombosis involving the majority of the superior sagittal sinus and left transverse and sigmoid sinuses. Currently on heparin  IV and normal saline, hypercoagulable work up ordered.  CTA, 2D echo, and venous duplex pending.  LP deferred given no meningismal signs or symptoms at this time.  No other concerns at this time.  OBJECTIVE  CBC    Component Value Date/Time   WBC 9.7 01/08/2024 0630   RBC 4.73 01/08/2024 0630   HGB 13.5 01/08/2024 0630   HCT 40.1 01/08/2024 0630   PLT 285 01/08/2024 0630   MCV 84.8 01/08/2024 0630   MCH 28.5 01/08/2024 0630   MCHC 33.7 01/08/2024 0630   RDW 13.2 01/08/2024 0630   LYMPHSABS 1.7 01/06/2024 1236   MONOABS 0.6 01/06/2024 1236   EOSABS 0.0 01/06/2024 1236   BASOSABS 0.0 01/06/2024 1236    BMET    Component Value Date/Time   NA 135 01/07/2024 0803   K 3.9 01/07/2024 0803   CL 100 01/07/2024 0803   CO2 25 01/07/2024 0803   GLUCOSE 113 (H) 01/07/2024 0803   BUN 7 01/07/2024 0803   CREATININE 0.61 01/07/2024 0803   CREATININE 0.46 (L) 10/21/2021 1523   CALCIUM 8.8 (L) 01/07/2024 0803   GFRNONAA >60 01/07/2024 0803   IMAGING past 24 hours No results found.  Vitals:   01/07/24 2028 01/07/24 2348 01/08/24 0400 01/08/24 0818  BP: (!) 141/85 (!) 144/86 (!) 136/98 (!) 144/96  Pulse: 79 64 71 80  Resp: 17 18 16 16   Temp: 98.8 F (37.1 C) 98.3 F (36.8 C) 97.6 F (36.4 C) (!) 97.4 F (36.3 C)  TempSrc: Oral Oral Oral Oral  SpO2: 100% 100% 100% 100%  Weight:      Height:       PHYSICAL EXAM General:  Alert, well-nourished, well-developed patient in no acute distress Psych:  Mood and affect appropriate for  situation CV: Regular rate and rhythm on monitor Respiratory:  Regular, unlabored respirations on room air  NEURO:  Mental Status: AA&Ox3, patient is able to give clear and coherent history Speech/Language: speech is without dysarthria or aphasia.  Naming, repetition, fluency, and comprehension intact.  Cranial Nerves:  II: PERRL. Visual fields full.  III, IV, VI: EOMI. Eyelids elevate symmetrically.  V: Sensation is intact to light touch and symmetrical to face.  VII: Face is symmetrical resting and smiling VIII: hearing intact to voice. IX, X: Palate elevates symmetrically. Phonation is normal.  KP:Dynloizm shrug 5/5. XII: tongue is midline without fasciculations. Motor: 5/5 strength to all muscle groups tested.  Tone: is normal and bulk is normal Sensation- Intact to light touch bilaterally. Extinction absent to light touch to DSS.   Coordination: FTN intact bilaterally, HKS: no ataxia in BLE. No drift.  Gait- deferred  ASSESSMENT/PLAN  David [name] is a 23 y.o. male with history of  mixed connective tissue disorder and migraines presenting with 3 weeks of headache.  Patient currently afebrile, no leukocytosis so low suspicion for infectious source of CVST at this time, LP deferred at this time for these reasons.  Neuro exam is nonfocal, patient denying any sick symptoms.  CVST: dural venous  sinus thrombosis involving SSS, left transverse sinus and sigmoid sinus, etiology likely hypercoagulability 2/2 mixed connective tissue disease and dehydration MRI extensive dural sinus thrombosis involving the superior sagittal sinus, left transverse sinus, and left sigmoid sinus. Cortical veins are patent. Left mastoid effusion. No obstructing nasopharyngeal lesion.  MRV dural venous sinus thrombosis involving the majority of the superior sagittal sinus and the dominant left transverse and sigmoid sinuses.  CTA pending 2D Echo & bubble EF 65-70%, bubble (-) no evidence of interarterial  shunt Venous duplex no evidence of DVT bilaterally Hypercoagulable work up pending LDL ordered  HgbA1c ordered VTE prophylaxis -heparin  IV No antithrombotic prior to admission, now on heparin  IV, recommend transition to eliquis  once tolerating with heparin  IV  Therapy recommendations:  no PT/OT needed at this time Disposition: home pending workup  Left hearing loss Left mastoid effusion MRI showed left mastoid effusion, no mastoiditis  CT head to show mastoid sinus pending Likely caused by increased venous pressure in the mastoid sinus from left sided CVST Initially concerning for otitis media On outpt Augmentin , OK to complete the course  Mixed Connective Tissue Disorder On home CellCept  and Plaquenil  follows with [facility] rheumatology  Other Stroke Risk Factors Migraines  Other Active Problems Headaches -Fioricet  every 6 as needed Neck stiffness -resolved, denies pain/stiffness with active or passive ROM, deferred LP at this time  Hospital day # 2    ATTENDING NOTE: I reviewed above note and agree with the assessment and plan. Pt was seen and examined.   RN at the bedside. Pt reclining in bed, neuro intact. Still complaining of HA 7/10 for 3 weeks, no significant improvement. Left hearing loss for 1 week and some improvement over time. On Augmentin  at home. No fever, or leukocytosis. Complaining of neck stiffness. On exam, pt would not relax neck muscle, but no nuchal rigidity, no pain on flexion of neck. Pt left hearing impairment likely due to left mastoid effusion from CVST. Check CTA head and neck, pending LDL A1C and UDS. On heparin  IV and fioricet . No need of LP at this time. Will follow.   For detailed assessment and plan, please refer to above as I have made changes wherever appropriate.   Ary Cummins, MD PhD Stroke Neurology 01/08/2024 3:17 PM  I discussed with Dr. Drusilla. I spent extensive total face-to-face time with the patient and family, reviewing test results,  images and medication, and discussing the diagnosis, treatment plan and potential prognosis. This patient's care requiresreview of multiple databases, neurological assessment, discussion with family, other specialists and medical decision making of high complexity.      To contact Stroke Continuity provider, please refer to [email or web address]. After hours, contact General Neurology

## 2024-01-08 NOTE — TOC CM/SW Note (Signed)
 Transition of Care [facility]) - Inpatient Brief Assessment   Patient Details  Name: David [name] MRN: [id number] Date of Birth: [birth year]  Transition of Care [facility] CM/SW Contact:    [name], LCSW Phone Number: 01/08/2024, 4:19 PM   Clinical Narrative:   Patient from home, found to have Dural venous sinus thrombosis. Workup ongoing. No Inpatient care management needs identified at this time, please place consult if needs arise.    Transition of Care Asessment: Insurance and Status: Insurance coverage has been reviewed Patient has primary care physician: Yes Home environment has been reviewed: Home Prior level of function:: Independent Prior/Current Home Services: No current home services Social Drivers of Health Review: SDOH reviewed no interventions necessary Readmission risk has been reviewed: Yes Transition of care needs: no transition of care needs at this time

## 2024-01-08 NOTE — Progress Notes (Addendum)
 [facility]  PROGRESS NOTE  David [name] FMW:[id number] DOB: 09-25-2000 DOA: 01/06/2024 PCP: Alston Silvio BROCKS, FNP   Brief HPI:   23 y.o. male with medical history significant for mixed connective tissue disease.  Patient presented to the ED with complaints of ongoing headaches of 3 weeks duration.  Reports fatigue nausea and vomiting with headaches. Headaches feel like pressure around the base of the skull, with neck stiffness.  On ED 9/18 and exam resolved in the ED, and urgent care 9/10 was diagnosed with acute suppurative otitis media of the left ear based on his cold symptoms.  He was started on a course of Augmentin .  Weakness of his extremities, no facial asymmetry, no speech changes.  He denies history of blood clots.  No pain or swelling swelling of lower extremities.     Assessment/Plan:   Extensive Dural venous sinus thrombosis Chronic severe headaches -Presented with headache, no neurodeficits -MRI brain, MR MRV head- Dural venous sinus thrombosis involving the majority of the superior sagittal sinus and the dominant left transverse and sigmoid sinuses.   -History of connective tissue disease.  No prior history of blood clots. -EDP talked to neurologist Dr. Michaela who requested admission to [facility], IV heparin  started -Transferred to [facility] -Follow-up echocardiogram, venous duplex of lower extremities -Neurology following- -continue IV heparin  infusion managed by pharm D   Mixed connective tissue disease  Per Care Everywhere manifestations-inflammatory arthritis, Raynaud's, myositis, NSIP-ILD, fatigue, weight loss, trace pleural and trivial pericardial effusions.  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 - Currently on mycophenolate  and Plaquenil  and reports non compliance.   Follows with rheumatologist at [facility].  Otitis media -started on Augmentin  as outpatient.  Will continue with Augmentin  1 tablet p.o. twice daily    Medications      amoxicillin -clavulanate  1 tablet Oral Q12H   hydroxychloroquine   200 mg Oral Daily   mycophenolate   1,000 mg Oral BID     Data Reviewed:   CBG:  No results for input(s): GLUCAP in the last 168 hours.  SpO2: 100 %    Vitals:   01/07/24 1510 01/07/24 2028 01/07/24 2348 01/08/24 0400  BP: (!) 150/95 (!) 141/85 (!) 144/86 (!) 136/98  Pulse: 78 79 64 71  Resp: 18 17 18 16   Temp:  98.8 F (37.1 C) 98.3 F (36.8 C) 97.6 F (36.4 C)  TempSrc:  Oral Oral Oral  SpO2: 100% 100% 100% 100%  Weight:      Height:          Data Reviewed:  Basic Metabolic Panel: Recent Labs  Lab 01/01/24 0905 01/06/24 1236 01/07/24 0803  NA 135 137 135  K 3.9 4.4 3.9  CL 99 101 100  CO2 25 26 25   GLUCOSE 91 84 113*  BUN 8 7 7   CREATININE 0.66 0.67 0.61  CALCIUM 9.3 9.1 8.8*    CBC: Recent Labs  Lab 01/01/24 0905 01/06/24 1236 01/07/24 0803 01/08/24 0630  WBC 10.4 8.4 8.5 9.7  NEUTROABS 7.3 6.0  --   --   HGB 14.6 13.7 13.1 13.5  HCT 42.6 41.2 38.4* 40.1  MCV 85.0 87.7 85.5 84.8  PLT 184 284 272 285    LFT Recent Labs  Lab 01/01/24 0905  AST 50*  ALT 43  ALKPHOS 78  BILITOT 0.9  PROT 8.9*  ALBUMIN  3.9     Antibiotics: Anti-infectives (From admission, onward)    Start     Dose/Rate Route Frequency Ordered Stop  01/07/24 1000  hydroxychloroquine  (PLAQUENIL ) tablet 200 mg        200 mg Oral Daily 01/06/24 2145     01/06/24 2200  amoxicillin -clavulanate (AUGMENTIN ) 875-125 MG per tablet 1 tablet        1 tablet Oral Every 12 hours 01/06/24 2145          DVT prophylaxis: Full dose heparin   Code Status: Full code  Family Communication: No family at bedside   CONSULTS neurology   Subjective   Denies headache   Objective    Physical Examination:   General-appears in no acute distress Heart-S1-S2, regular, no murmur auscultated Lungs-clear to auscultation bilaterally, no wheezing or crackles auscultated Abdomen-soft, nontender, no  organomegaly Extremities-no edema in the lower extremities Neuro-alert, oriented x3, no focal deficit noted  Status is: Inpatient:             Alcides Nutting S Gurfateh Mcclain   [facility] If 7PM-7AM, please contact night-coverage at [email or web address], Office  [phone]   01/08/2024, 7:51 AM  LOS: 2 days

## 2024-01-08 NOTE — Progress Notes (Signed)
  Echocardiogram 2D Echocardiogram has been performed.  David [name] 01/08/2024, 10:31 AM

## 2024-01-08 NOTE — Plan of Care (Signed)
 Patient ID: David [name], male   DOB: Jul 30, 2000, 23 y.o.   MRN: [id number]  Problem: Education: Goal: Knowledge of General Education information will improve Description: Including pain rating scale, medication(s)/side effects and non-pharmacologic comfort measures Outcome: Progressing   Problem: Health Behavior/Discharge Planning: Goal: Ability to manage health-related needs will improve Outcome: Progressing   Problem: Clinical Measurements: Goal: Ability to maintain clinical measurements within normal limits will improve Outcome: Progressing Goal: Will remain free from infection Outcome: Progressing Goal: Diagnostic test results will improve Outcome: Progressing Goal: Respiratory complications will improve Outcome: Progressing Goal: Cardiovascular complication will be avoided Outcome: Progressing   Problem: Activity: Goal: Risk for activity intolerance will decrease Outcome: Progressing   Problem: Nutrition: Goal: Adequate nutrition will be maintained Outcome: Progressing   Problem: Coping: Goal: Level of anxiety will decrease Outcome: Progressing   Problem: Elimination: Goal: Will not experience complications related to bowel motility Outcome: Progressing Goal: Will not experience complications related to urinary retention Outcome: Progressing   Problem: Pain Managment: Goal: General experience of comfort will improve and/or be controlled Outcome: Progressing   Problem: Safety: Goal: Ability to remain free from injury will improve Outcome: Progressing   Problem: Skin Integrity: Goal: Risk for impaired skin integrity will decrease Outcome: Progressing    [name], RN

## 2024-01-08 NOTE — Plan of Care (Signed)
  Problem: Clinical Measurements: Goal: Ability to maintain clinical measurements within normal limits will improve Outcome: Progressing Goal: Will remain free from infection Outcome: Progressing Goal: Diagnostic test results will improve Outcome: Progressing Goal: Respiratory complications will improve Outcome: Progressing Goal: Cardiovascular complication will be avoided Outcome: Progressing   Problem: Activity: Goal: Risk for activity intolerance will decrease Outcome: Progressing   Problem: Coping: Goal: Level of anxiety will decrease Outcome: Progressing   Problem: Pain Managment: Goal: General experience of comfort will improve and/or be controlled Outcome: Progressing

## 2024-01-08 NOTE — Progress Notes (Signed)
 PHARMACY - ANTICOAGULATION CONSULT NOTE  Pharmacy Consult for heparin  Indication: dural venous sinus thrombosis  Allergies  Allergen Reactions   Fish Allergy Itching    Pt states he is allergic to lobster     Patient Measurements: Height: 5' 11 (180.3 cm) Weight: 69 kg (152 lb 1.9 oz) IBW/kg (Calculated) : 75.3 HEPARIN  DW (KG): 69  Vital Signs: Temp: 97.4 F (36.3 C) (09/15 0818) Temp Source: Oral (09/15 0818) BP: 144/96 (09/15 0818) Pulse Rate: 80 (09/15 0818)  Labs: Recent Labs    01/06/24 1236 01/06/24 2319 01/07/24 0803 01/07/24 1807 01/08/24 0630  HGB 13.7  --  13.1  --  13.5  HCT 41.2  --  38.4*  --  40.1  PLT 284  --  272  --  285  HEPARINUNFRC  --    < > 0.31 0.44 0.43  CREATININE 0.67  --  0.61  --   --    < > = values in this interval not displayed.    Estimated Creatinine Clearance: 142.6 mL/min (by C-G formula based on SCr of 0.61 mg/dL).   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 New orders to start IV heparin . Not on anticoagulants prior to admission.   Heparin  at goal this morning at 0.31 on 1300 units/hr. No bleeding issues noted.   9/15 AM update: HL 0.43 Hgb / PLT wnl No signs of bleeding / pauses in gtt  Goal of Therapy:  Heparin  level 0.3-0.5 units/ml  Monitor platelets by anticoagulation protocol: Yes   Plan:  Continue heparin  infusion at 1350 units/hr  Check anti-Xa level daily while on heparin  Continue to monitor H&H and platelets     Alazay Leicht BS, PharmD, BCPS Clinical Pharmacist 01/08/2024 8:57 AM  Contact: [phone] after 3 PM

## 2024-01-09 DIAGNOSIS — D6859 Other primary thrombophilia: Secondary | ICD-10-CM | POA: Diagnosis not present

## 2024-01-09 DIAGNOSIS — E860 Dehydration: Secondary | ICD-10-CM | POA: Diagnosis not present

## 2024-01-09 DIAGNOSIS — M359 Systemic involvement of connective tissue, unspecified: Secondary | ICD-10-CM | POA: Diagnosis not present

## 2024-01-09 DIAGNOSIS — M351 Other overlap syndromes: Secondary | ICD-10-CM | POA: Diagnosis not present

## 2024-01-09 DIAGNOSIS — G08 Intracranial and intraspinal phlebitis and thrombophlebitis: Secondary | ICD-10-CM | POA: Diagnosis not present

## 2024-01-09 LAB — LIPID PANEL
Cholesterol: 123 mg/dL (ref 0–200)
HDL: 30 mg/dL — ABNORMAL LOW (ref >40)
LDL Cholesterol: 76 mg/dL (ref 0–99)
Total CHOL/HDL Ratio: 4.1 ratio
Triglycerides: 83 mg/dL (ref <150)
VLDL: 17 mg/dL (ref 0–40)

## 2024-01-09 LAB — CBC
HCT: 38.2 % — ABNORMAL LOW (ref 39.0–52.0)
Hemoglobin: 12.9 g/dL — ABNORMAL LOW (ref 13.0–17.0)
MCH: 28.6 pg (ref 26.0–34.0)
MCHC: 33.8 g/dL (ref 30.0–36.0)
MCV: 84.7 fL (ref 80.0–100.0)
Platelets: 251 K/uL (ref 150–400)
RBC: 4.51 MIL/uL (ref 4.22–5.81)
RDW: 13.2 % (ref 11.5–15.5)
WBC: 8.9 K/uL (ref 4.0–10.5)
nRBC: 0 % (ref 0.0–0.2)

## 2024-01-09 LAB — ENA+DNA/DS+ANTICH+CENTRO+JO...
Anti JO-1: <0.2 AI (ref 0.0–0.9)
Centromere Ab Screen: <0.2 AI (ref 0.0–0.9)
Chromatin Ab SerPl-aCnc: >8 AI — ABNORMAL HIGH (ref 0.0–0.9)
ENA SM Ab Ser-aCnc: 1.7 AI — ABNORMAL HIGH (ref 0.0–0.9)
Ribonucleic Protein: >8 AI — ABNORMAL HIGH (ref 0.0–0.9)
SSA (Ro) (ENA) Antibody, IgG: 7.5 AI — ABNORMAL HIGH (ref 0.0–0.9)
SSB (La) (ENA) Antibody, IgG: <0.2 AI (ref 0.0–0.9)
Scleroderma (Scl-70) (ENA) Antibody, IgG: 0.7 AI (ref 0.0–0.9)
ds DNA Ab: 6 [IU]/mL (ref 0–9)

## 2024-01-09 LAB — HEMOGLOBIN A1C
Hgb A1c MFr Bld: 4.9 % (ref 4.8–5.6)
Mean Plasma Glucose: 93.93 mg/dL

## 2024-01-09 LAB — PROTIME-INR
INR: 1.1 (ref 0.8–1.2)
Prothrombin Time: 14.8 s (ref 11.4–15.2)

## 2024-01-09 LAB — BASIC METABOLIC PANEL WITH GFR
Anion gap: 9 (ref 5–15)
BUN: <5 mg/dL — ABNORMAL LOW (ref 6–20)
CO2: 24 mmol/L (ref 22–32)
Calcium: 8.9 mg/dL (ref 8.9–10.3)
Chloride: 102 mmol/L (ref 98–111)
Creatinine, Ser: 0.62 mg/dL (ref 0.61–1.24)
GFR, Estimated: >60 mL/min (ref >60)
Glucose, Bld: 105 mg/dL — ABNORMAL HIGH (ref 70–99)
Potassium: 3.8 mmol/L (ref 3.5–5.1)
Sodium: 135 mmol/L (ref 135–145)

## 2024-01-09 LAB — HIV ANTIBODY (ROUTINE TESTING W REFLEX): HIV Screen 4th Generation wRfx: NONREACTIVE

## 2024-01-09 LAB — RPR: RPR Ser Ql: NONREACTIVE

## 2024-01-09 LAB — ANA W/REFLEX IF POSITIVE: Anti Nuclear Antibody (ANA): POSITIVE — AB

## 2024-01-09 LAB — HOMOCYSTEINE: Homocysteine: 6.7 umol/L (ref 0.0–14.5)

## 2024-01-09 LAB — HEPARIN LEVEL (UNFRACTIONATED): Heparin Unfractionated: 0.45 [IU]/mL (ref 0.30–0.70)

## 2024-01-09 MED ORDER — APIXABAN 5 MG PO TABS
10.0000 mg | ORAL_TABLET | Freq: Two times a day (BID) | ORAL | Status: DC
  Administered 2024-01-09 – 2024-01-10 (×3): 10 mg via ORAL
  Filled 2024-01-09 (×4): qty 2

## 2024-01-09 MED ORDER — APIXABAN 5 MG PO TABS
5.0000 mg | ORAL_TABLET | Freq: Two times a day (BID) | ORAL | Status: DC
Start: 2024-01-16 — End: 2024-01-10

## 2024-01-09 NOTE — Progress Notes (Addendum)
 PHARMACY - ANTICOAGULATION CONSULT NOTE  Pharmacy Consult for heparin  transition to apixaban  Indication: dural venous sinus thrombosis  Allergies  Allergen Reactions   Fish Allergy Itching    Pt states he is allergic to lobster     Patient Measurements: Height: 5' 11 (180.3 cm) Weight: 69 kg (152 lb 1.9 oz) IBW/kg (Calculated) : 75.3 HEPARIN  DW (KG): 69  Vital Signs: Temp: 98.1 F (36.7 C) (09/16 0326) Temp Source: Oral (09/16 0326) BP: 150/92 (09/16 0326) Pulse Rate: 67 (09/16 0326)  Labs: Recent Labs    01/06/24 1236 01/06/24 2319 01/07/24 0803 01/07/24 1807 01/08/24 0630 01/09/24 0511  HGB 13.7  --  13.1  --  13.5 12.9*  HCT 41.2  --  38.4*  --  40.1 38.2*  PLT 284  --  272  --  285 251  LABPROT  --   --   --   --   --  14.8  INR  --   --   --   --   --  1.1  HEPARINUNFRC  --    < > 0.31 0.44 0.43 0.45  CREATININE 0.67  --  0.61  --   --  0.62   < > = values in this interval not displayed.    Estimated Creatinine Clearance: 142.6 mL/min (by C-G formula based on SCr of 0.62 mg/dL).   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 New orders to start IV heparin . Not on anticoagulants prior to admission.   Heparin  at goal this morning at 0.31 on 1300 units/hr. No bleeding issues noted.   9/16 AM update: HL 0.45 Hgb 12.9 PLT wnl No signs of bleeding / pauses in gtt  Goal of Therapy:  Monitor platelets by anticoagulation protocol: Yes   Plan:  DC heparin  infusion Start apixaban  10 mg po bid x7 days f/b 5 mg po bid thereafter Monitor for signs of bleeding     Braden Cimo BS, PharmD, BCPS Clinical Pharmacist 01/09/2024 10:50 AM  Contact: [phone] after 3 PM

## 2024-01-09 NOTE — Progress Notes (Addendum)
 STROKE TEAM PROGRESS NOTE   INTERIM HISTORY/SUBJECTIVE Transition from IV heparin  to oral Eliquis  today.  His headache and neck stiffness have resolved today, neuro exam remains unchanged.  Patient remains afebrile with no leukocytosis, no concerns for septic CVST.  No new concerns at this time.  OBJECTIVE  CBC    Component Value Date/Time   WBC 8.9 01/09/2024 0511   RBC 4.51 01/09/2024 0511   HGB 12.9 (L) 01/09/2024 0511   HCT 38.2 (L) 01/09/2024 0511   PLT 251 01/09/2024 0511   MCV 84.7 01/09/2024 0511   MCH 28.6 01/09/2024 0511   MCHC 33.8 01/09/2024 0511   RDW 13.2 01/09/2024 0511   LYMPHSABS 1.7 01/06/2024 1236   MONOABS 0.6 01/06/2024 1236   EOSABS 0.0 01/06/2024 1236   BASOSABS 0.0 01/06/2024 1236    BMET    Component Value Date/Time   NA 135 01/09/2024 0511   K 3.8 01/09/2024 0511   CL 102 01/09/2024 0511   CO2 24 01/09/2024 0511   GLUCOSE 105 (H) 01/09/2024 0511   BUN <5 (L) 01/09/2024 0511   CREATININE 0.62 01/09/2024 0511   CREATININE 0.46 (L) 10/21/2021 1523   CALCIUM 8.9 01/09/2024 0511   GFRNONAA >60 01/09/2024 0511    IMAGING past 24 hours CT ANGIO HEAD NECK W WO CM Result Date: 01/09/2024 CLINICAL DATA:  Neuro deficit, acute, stroke suspected EXAM: CT ANGIOGRAPHY HEAD AND NECK WITH AND WITHOUT CONTRAST TECHNIQUE: Multidetector CT imaging of the head and neck was performed using the standard protocol during bolus administration of intravenous contrast. Multiplanar CT image reconstructions and MIPs were obtained to evaluate the vascular anatomy. Carotid stenosis measurements (when applicable) are obtained utilizing NASCET criteria, using the distal internal carotid diameter as the denominator. RADIATION DOSE REDUCTION: This exam was performed according to the departmental dose-optimization program which includes automated exposure control, adjustment of the mA and/or kV according to patient size and/or use of iterative reconstruction technique. CONTRAST:  60mL  OMNIPAQUE  IOHEXOL  350 MG/ML SOLN COMPARISON:  MRI head 01/06/2024. FINDINGS: CT HEAD FINDINGS Brain: No evidence of acute infarction, hemorrhage, hydrocephalus, or mass lesion. Suspected 4 mm thick hyperdense subdural fluid collection along the under surface of the left tentorial leaflet, likely similar to recent MRI. No substantial mass effect. Vascular: See below. Skull: No acute fracture. Sinuses/Orbits: No acute finding. Review of the MIP images confirms the above findings CTA NECK FINDINGS Aortic arch: Great vessel origins are patent without significant stenosis. Right carotid system: No evidence of dissection, stenosis (50% or greater), or occlusion. Left carotid system: No evidence of dissection, stenosis (50% or greater), or occlusion. Vertebral arteries: Codominant. No evidence of dissection, stenosis (50% or greater), or occlusion. Skeleton: No acute fracture. Other neck: No acute abnormality on limited assessment. Upper chest: Lung apices are clear. Review of the MIP images confirms the above findings CTA HEAD FINDINGS Anterior circulation: Bilateral intracranial ICAs, MCAs, and ACAs are patent without proximal hemodynamically significant stenosis. No aneurysm identified. Posterior circulation: Bilateral intradural vertebral arteries, basilar artery and bilateral posterior rods are patent without proximal hemodynamically significant stenosis. No aneurysm identified. Venous sinuses: No well evaluated. Known extensive dural venous sinus thrombosis better seen on recent MRdonV. Review of the MIP images confirms the above findings IMPRESSION: 1. No large vessel arterial occlusion or proximal hemodynamically significant stenosis. 2. Suspected 4 mm thick hypodense subdural fluid collection along the under surface of the left tentorial leaflet, likely similar to recent MRI. No substantial mass effect. 3. Known extensive dural venous sinus  thrombosis better characterized on recent MRV. Electronically Signed   By:  Gilmore GORMAN Molt M.D.   On: 01/09/2024 02:09   ECHOCARDIOGRAM COMPLETE BUBBLE STUDY Result Date: 01/08/2024    ECHOCARDIOGRAM REPORT   Patient Name:   WYN NETTLE Date of Exam: 01/08/2024 Medical Rec #:  [id number]   Height:       71.0 in Accession #:    [id number]  Weight:       152.1 lb Date of Birth:  2001-03-22  BSA:          1.877 m Patient Age:    23 years    BP:           136/98 mmHg Patient Gender: M           HR:           72 bpm. Exam Location:  Inpatient Procedure: 2D Echo, Cardiac Doppler and Color Doppler (Both Spectral and Color            Flow Doppler were utilized during procedure). Indications:    Dural venous sinus thrombosis.  History:        Patient has prior history of Echocardiogram examinations, most                 recent 12/05/2021. Signs/Symptoms:Chest Pain and Fever.                 Connective tissue disorder. Pericardial effusion.  Sonographer:    Ellouise Mose RDCS Referring Phys: [id number] DEVON SHAFER IMPRESSIONS  1. Left ventricular ejection fraction, by estimation, is 65 to 70%. The left ventricle has normal function. The left ventricle has no regional wall motion abnormalities. Left ventricular diastolic parameters were normal.  2. Right ventricular systolic function is normal. The right ventricular size is normal.  3. The mitral valve is normal in structure. Trivial mitral valve regurgitation. No evidence of mitral stenosis.  4. The aortic valve is tricuspid. Aortic valve regurgitation is not visualized. No aortic stenosis is present.  5. Agitated saline contrast bubble study was negative, with no evidence of any interatrial shunt. FINDINGS  Left Ventricle: Left ventricular ejection fraction, by estimation, is 65 to 70%. The left ventricle has normal function. The left ventricle has no regional wall motion abnormalities. The left ventricular internal cavity size was normal in size. There is  no left ventricular hypertrophy. Left ventricular diastolic parameters were normal. Right  Ventricle: The right ventricular size is normal. No increase in right ventricular wall thickness. Right ventricular systolic function is normal. Left Atrium: Left atrial size was normal in size. Right Atrium: Right atrial size was normal in size. Pericardium: There is no evidence of pericardial effusion. Mitral Valve: The mitral valve is normal in structure. Trivial mitral valve regurgitation. No evidence of mitral valve stenosis. Tricuspid Valve: The tricuspid valve is normal in structure. Tricuspid valve regurgitation is trivial. No evidence of tricuspid stenosis. Aortic Valve: The aortic valve is tricuspid. Aortic valve regurgitation is not visualized. No aortic stenosis is present. Pulmonic Valve: The pulmonic valve was normal in structure. Pulmonic valve regurgitation is not visualized. No evidence of pulmonic stenosis. Aorta: The aortic root and ascending aorta are structurally normal, with no evidence of dilitation. IAS/Shunts: No atrial level shunt detected by color flow Doppler. Agitated saline contrast bubble study was negative, with no evidence of any interatrial shunt.  LEFT VENTRICLE PLAX 2D LVIDd:         4.20 cm     Diastology LVIDs:  2.50 cm     LV e' medial:    9.99 cm/s LV PW:         0.90 cm     LV E/e' medial:  6.9 LV IVS:        1.10 cm     LV e' lateral:   16.40 cm/s LVOT diam:     2.30 cm     LV E/e' lateral: 4.2 LV SV:         78 LV SV Index:   42 LVOT Area:     4.15 cm  LV Volumes (MOD) LV vol d, MOD A2C: 66.3 ml LV vol d, MOD A4C: 89.1 ml LV vol s, MOD A2C: 27.0 ml LV vol s, MOD A4C: 29.6 ml LV SV MOD A2C:     39.3 ml LV SV MOD A4C:     89.1 ml LV SV MOD BP:      50.0 ml RIGHT VENTRICLE             IVC RV S prime:     11.50 cm/s  IVC diam: 0.90 cm TAPSE (M-mode): 1.7 cm LEFT ATRIUM             Index        RIGHT ATRIUM           Index LA diam:        3.50 cm 1.86 cm/m   RA Area:     12.40 cm LA Vol (A2C):   25.0 ml 13.32 ml/m  RA Volume:   27.20 ml  14.49 ml/m LA Vol (A4C):    31.4 ml 16.73 ml/m LA Biplane Vol: 30.7 ml 16.36 ml/m  AORTIC VALVE LVOT Vmax:   99.60 cm/s LVOT Vmean:  64.500 cm/s LVOT VTI:    0.188 m  AORTA Ao Root diam: 3.30 cm MITRAL VALVE MV Area (PHT): 3.68 cm    SHUNTS MV Decel Time: 206 msec    Systemic VTI:  0.19 m MV E velocity: 68.90 cm/s  Systemic Diam: 2.30 cm MV A velocity: 53.95 cm/s MV E/A ratio:  1.28 Aditya Sabharwal Electronically signed by Ria Commander Signature Date/Time: 01/08/2024/1:15:43 PM    Final    VAS US  LOWER EXTREMITY VENOUS (DVT) Result Date: 01/08/2024  Lower Venous DVT Study Patient Name:  JAMESROBERT OHANESIAN  Date of Exam:   01/08/2024 Medical Rec #: [id number]    Accession #:    [id number] Date of Birth: Jan 29, 2001   Patient Gender: M Patient Age:   23 years Exam Location:  [facility] Procedure:      VAS US  LOWER EXTREMITY VENOUS (DVT) Referring Phys: JORENE LAST --------------------------------------------------------------------------------  Indications: Dural venous sinus thrombosis.  Comparison Study: Prevviosu study on 8.13.2023. Performing Technologist: Edilia Elden Appl  Examination Guidelines: A complete evaluation includes B-mode imaging, spectral Doppler, color Doppler, and power Doppler as needed of all accessible portions of each vessel. Bilateral testing is considered an integral part of a complete examination. Limited examinations for reoccurring indications may be performed as noted. The reflux portion of the exam is performed with the patient in reverse Trendelenburg.  +---------+---------------+---------+-----------+----------+--------------+ RIGHT    CompressibilityPhasicitySpontaneityPropertiesThrombus Aging +---------+---------------+---------+-----------+----------+--------------+ CFV      Full           Yes      Yes                                 +---------+---------------+---------+-----------+----------+--------------+ SFJ  Full           Yes      Yes                                  +---------+---------------+---------+-----------+----------+--------------+ FV Prox  Full                                                        +---------+---------------+---------+-----------+----------+--------------+ FV Mid   Full                                                        +---------+---------------+---------+-----------+----------+--------------+ FV DistalFull                                                        +---------+---------------+---------+-----------+----------+--------------+ PFV      Full                                                        +---------+---------------+---------+-----------+----------+--------------+ POP      Full           Yes      Yes                                 +---------+---------------+---------+-----------+----------+--------------+ PTV      Full                                                        +---------+---------------+---------+-----------+----------+--------------+ PERO     Full                                                        +---------+---------------+---------+-----------+----------+--------------+   +---------+---------------+---------+-----------+----------+--------------+ LEFT     CompressibilityPhasicitySpontaneityPropertiesThrombus Aging +---------+---------------+---------+-----------+----------+--------------+ CFV      Full           Yes      Yes                                 +---------+---------------+---------+-----------+----------+--------------+ SFJ      Full           Yes      Yes                                 +---------+---------------+---------+-----------+----------+--------------+  FV Prox  Full                                                        +---------+---------------+---------+-----------+----------+--------------+ FV Mid   Full                                                         +---------+---------------+---------+-----------+----------+--------------+ FV DistalFull                                                        +---------+---------------+---------+-----------+----------+--------------+ PFV      Full                                                        +---------+---------------+---------+-----------+----------+--------------+ POP      Full           Yes      Yes                                 +---------+---------------+---------+-----------+----------+--------------+ PTV      Full                                                        +---------+---------------+---------+-----------+----------+--------------+ PERO     Full                                                        +---------+---------------+---------+-----------+----------+--------------+     Summary: BILATERAL: - No evidence of deep vein thrombosis seen in the lower extremities, bilaterally. -No evidence of popliteal cyst, bilaterally.   *See table(s) above for measurements and observations. Electronically signed by Debby Robertson on 01/08/2024 at 11:36:18 AM.    Final     Vitals:   01/08/24 2045 01/08/24 2215 01/08/24 2301 01/09/24 0326  BP: (!) 156/103 (!) 148/98 (!) 152/93 (!) 150/92  Pulse: 74 64 67 67  Resp: 18  18 18   Temp: 97.9 F (36.6 C)  98.6 F (37 C) 98.1 F (36.7 C)  TempSrc: Oral  Oral Oral  SpO2: 100%  100% 99%  Weight:      Height:       PHYSICAL EXAM General:  Alert, well-nourished, well-developed patient in no acute distress Psych:  Mood and affect appropriate for situation CV: Regular rate and rhythm on monitor Respiratory:  Regular, unlabored respirations on room air  NEURO:  Mental Status: AA&Ox3, patient is able to give clear and coherent  history Speech/Language: speech is without dysarthria or aphasia.  Naming, repetition, fluency, and comprehension intact.  Cranial Nerves:  II: PERRL. Visual fields full.  III, IV, VI: EOMI.  Eyelids elevate symmetrically.  V: Sensation is intact to light touch and symmetrical to face.  VII: Face is symmetrical resting and smiling VIII: hearing intact to voice. IX, X: Palate elevates symmetrically. Phonation is normal.  KP:Dynloizm shrug 5/5. XII: tongue is midline without fasciculations. Motor: 5/5 strength to all muscle groups tested.  Tone: is normal and bulk is normal Sensation- Intact to light touch bilaterally. Extinction absent to light touch to DSS.   Coordination: FTN intact bilaterally, HKS: no ataxia in BLE. No drift.  Gait- deferred  Most Recent NIH 0   ASSESSMENT/PLAN  Mr. David [name] is a 23 y.o. male with history of mixed connective tissue disorder and migraines presenting with 3 weeks of headache.  Currently he does not endorse any headache or neck stiffness, and neuro exam remains nonfocal.  CVST: dural venous sinus thrombosis involving SSS, left transverse sinus and sigmoid sinus, etiology likely hypercoagulability 2/2 mixed connective tissue disease and dehydration MRI extensive dural sinus thrombosis involving the superior sagittal sinus, left transverse sinus, and left sigmoid sinus. Cortical veins are patent. Left mastoid effusion. No obstructing nasopharyngeal lesion.  MRV dural venous sinus thrombosis involving the majority of the superior sagittal sinus and the dominant left transverse and sigmoid sinuses.  CTA with no LVO or hemodynamically significant stenosis 2D Echo & bubble EF 65-70%, bubble (-) no evidence of interarterial shunt Venous duplex no evidence of DVT bilaterally Hypercoagulable work up follow-up outpatient, still pending LDL 76 HgbA1c 4.9 VTE prophylaxis -heparin  IV No antithrombotic prior to admission, d/c heparin  IV, start apixaban  10 mg twice daily for 7 days followed by 5 mg twice daily  Therapy recommendations:  none Disposition: home pending workup  Left hearing loss Left mastoid effusion MRI showed left mastoid effusion,  no mastoiditis  CT head with no mastoiditis present Likely caused by increased venous pressure in the mastoid sinus from left sided CVST Initially concerning for otitis media, likely effusion Completed 5-day course of Augmentin    Mixed Connective Tissue Disorder ANA positive  Autoimmune work up positive Continue home CellCept  and Plaquenil  follows with [facility] rheumatology, patient trying to see rheumatologist in [facility]   Lipid management LDL 76, goal less than 100 No statin needed given no atherosclerosis  Other Stroke Risk Factors Migraines   Other Active Problems Headaches -Fioricet  every 6h as needed Neck stiffness -resolved, denies pain/stiffness with active or passive ROM, deferred LP at this time  Hospital day # 3  Patient remains stable from a neurological perspective, we will sign off at this time.   ATTENDING NOTE: I reviewed above note and agree with the assessment and plan. Pt was seen and examined.   No family at bedside.  Patient doing well, stated headache and neck stiffness have improved.  Neuro still intact.  Switched from heparin  IV to Eliquis .  Continue on discharge.  Outpatient follow-up with rheumatology.  He has been followed with [facility] rheumatology, now prefer rheumatology in [place].  Will let social worker working on that.  PT and OT no recommendation.  For detailed assessment and plan, please refer to above as I have made changes wherever appropriate.   Neurology will sign off. Please call with questions. Pt will follow up with stroke clinic NP at [facility] in about 4 weeks. Thanks for the consult.   Ary Cummins, MD PhD Stroke  Neurology 01/09/2024 3:28 PM    To contact Stroke Continuity provider, please refer to [email or web address]. After hours, contact General Neurology

## 2024-01-09 NOTE — Progress Notes (Addendum)
 [facility]  PROGRESS NOTE  Cuong Moorman FMW:[id number] DOB: 06/06/2000 DOA: 01/06/2024 PCP: Alston Silvio BROCKS, FNP   Brief HPI:   23 y.o. male with medical history significant for mixed connective tissue disease.  Patient presented to the ED with complaints of ongoing headaches of 3 weeks duration.  Reports fatigue nausea and vomiting with headaches. Headaches feel like pressure around the base of the skull, with neck stiffness.  On ED 9/18 and exam resolved in the ED, and urgent care 9/10 was diagnosed with acute suppurative otitis media of the left ear based on his cold symptoms.  He was started on a course of Augmentin .  Weakness of his extremities, no facial asymmetry, no speech changes.  He denies history of blood clots.  No pain or swelling swelling of lower extremities.     Assessment/Plan:   Extensive Dural venous sinus thrombosis Chronic severe headaches -Presented with headache, no neurodeficits -MRI brain, MR MRV head- Dural venous sinus thrombosis involving the majority of the superior sagittal sinus and the dominant left transverse and sigmoid sinuses.   -History of connective tissue disease.  No prior history of blood clots. -EDP talked to neurologist Dr. Michaela who requested admission to [facility], IV heparin  started -Transferred to [facility] -Follow-up echocardiogram, venous duplex of lower extremities -Neurology following -Will switch from IV heparin  to Eliquis  today He will need to be on Eliquis  for at least 6 to 12 months, minimum 6 months.  Discussed with neurology, Dr. Jerri -He will need referral with hematology and rheumatology as outpatient -Follow-up with neurology as outpatient   Mixed connective tissue disease  Per Care Everywhere manifestations-inflammatory arthritis, Raynaud's, myositis, NSIP-ILD, fatigue, weight loss, trace pleural and trivial pericardial effusions.  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 - Currently on mycophenolate  and  Plaquenil  and reports non compliance.   Follows with rheumatologist at [facility]. - Wants to find a rheumatologist in [facility]  Otitis media -started on Augmentin  as outpatient.   - Has completed 5 days of Augmentin  as outpatient.  No further symptoms of acute otitis media - Augmentin  has been discontinued  Concern for STDs - Patient concern for STDs, will order GC, chlamydia, RPR, HIV  Medications     amoxicillin -clavulanate  1 tablet Oral Q12H   hydroxychloroquine   200 mg Oral Daily   mycophenolate   1,000 mg Oral BID     Data Reviewed:   CBG:  No results for input(s): GLUCAP in the last 168 hours.  SpO2: 100 %    Vitals:   01/08/24 2215 01/08/24 2301 01/09/24 0326 01/09/24 0856  BP: (!) 148/98 (!) 152/93 (!) 150/92 (!) 136/92  Pulse: 64 67 67 69  Resp:  18 18 15   Temp:  98.6 F (37 C) 98.1 F (36.7 C) 97.7 F (36.5 C)  TempSrc:  Oral Oral Oral  SpO2:  100% 99% 100%  Weight:      Height:          Data Reviewed:  Basic Metabolic Panel: Recent Labs  Lab 01/06/24 1236 01/07/24 0803 01/09/24 0511  NA 137 135 135  K 4.4 3.9 3.8  CL 101 100 102  CO2 26 25 24   GLUCOSE 84 113* 105*  BUN 7 7 <5*  CREATININE 0.67 0.61 0.62  CALCIUM 9.1 8.8* 8.9    CBC: Recent Labs  Lab 01/06/24 1236 01/07/24 0803 01/08/24 0630 01/09/24 0511  WBC 8.4 8.5 9.7 8.9  NEUTROABS 6.0  --   --   --  HGB 13.7 13.1 13.5 12.9*  HCT 41.2 38.4* 40.1 38.2*  MCV 87.7 85.5 84.8 84.7  PLT 284 272 285 251    LFT No results for input(s): AST, ALT, ALKPHOS, BILITOT, PROT, ALBUMIN  in the last 168 hours.    Antibiotics: Anti-infectives (From admission, onward)    Start     Dose/Rate Route Frequency Ordered Stop   01/07/24 1000  hydroxychloroquine  (PLAQUENIL ) tablet 200 mg        200 mg Oral Daily 01/06/24 2145     01/06/24 2200  amoxicillin -clavulanate (AUGMENTIN ) 875-125 MG per tablet 1 tablet        1 tablet Oral Every 12 hours 01/06/24 2145           DVT prophylaxis: Full dose heparin   Code Status: Full code  Family Communication: No family at bedside   CONSULTS neurology   Subjective   Denies headache.  Concern for having STDs.   Objective    Physical Examination:   General-appears in no acute distress Heart-S1-S2, regular, no murmur auscultated Lungs-clear to auscultation bilaterally, no wheezing or crackles auscultated Abdomen-soft, nontender, no organomegaly Extremities-no edema in the lower extremities Neuro-alert, oriented x3, no focal deficit noted  Status is: Inpatient:          Mehar Sagen S Starr Urias   [facility] If 7PM-7AM, please contact night-coverage at [email or web address], Office  [phone]   01/09/2024, 9:04 AM  LOS: 3 days

## 2024-01-09 NOTE — Discharge Instructions (Addendum)
 Information on my medicine - ELIQUIS  (apixaban )  This medication education was reviewed with me or my healthcare representative as part of my discharge preparation.   Why was Eliquis  prescribed for you? Eliquis  was prescribed to treat blood clots that may have been found in the veins of your legs (deep vein thrombosis) or in your lungs (pulmonary embolism) and to reduce the risk of them occurring again.  What do You need to know about Eliquis  ? The starting dose is 10 mg (two 5 mg tablets) taken TWICE daily for the FIRST SEVEN (7) DAYS, then on 01/16/2024 AM  the dose is reduced to ONE 5 mg tablet taken TWICE daily.  Eliquis  may be taken with or without food.   Try to take the dose about the same time in the morning and in the evening. If you have difficulty swallowing the tablet whole please discuss with your pharmacist how to take the medication safely.  Take Eliquis  exactly as prescribed and DO NOT stop taking Eliquis  without talking to the doctor who prescribed the medication.  Stopping may increase your risk of developing a new blood clot.  Refill your prescription before you run out.  After discharge, you should have regular check-up appointments with your healthcare provider that is prescribing your Eliquis .    What do you do if you miss a dose? If a dose of ELIQUIS  is not taken at the scheduled time, take it as soon as possible on the same day and twice-daily administration should be resumed. The dose should not be doubled to make up for a missed dose.  Important Safety Information A possible side effect of Eliquis  is bleeding. You should call your healthcare provider right away if you experience any of the following: Bleeding from an injury or your nose that does not stop. Unusual colored urine (red or dark brown) or unusual colored stools (red or black). Unusual bruising for unknown reasons. A serious fall or if you hit your head (even if there is no bleeding).  Some  medicines may interact with Eliquis  and might increase your risk of bleeding or clotting while on Eliquis . To help avoid this, consult your healthcare provider or pharmacist prior to using any new prescription or non-prescription medications, including herbals, vitamins, non-steroidal anti-inflammatory drugs (NSAIDs) and supplements.  This website has more information on Eliquis  (apixaban ): [email or web address]

## 2024-01-09 NOTE — Plan of Care (Signed)

## 2024-01-10 ENCOUNTER — Other Ambulatory Visit (HOSPITAL_COMMUNITY): Payer: Self-pay

## 2024-01-10 DIAGNOSIS — G08 Intracranial and intraspinal phlebitis and thrombophlebitis: Secondary | ICD-10-CM | POA: Diagnosis not present

## 2024-01-10 LAB — BASIC METABOLIC PANEL WITH GFR
Anion gap: 9 (ref 5–15)
BUN: <5 mg/dL — ABNORMAL LOW (ref 6–20)
CO2: 24 mmol/L (ref 22–32)
Calcium: 9.4 mg/dL (ref 8.9–10.3)
Chloride: 103 mmol/L (ref 98–111)
Creatinine, Ser: 0.71 mg/dL (ref 0.61–1.24)
GFR, Estimated: >60 mL/min (ref >60)
Glucose, Bld: 87 mg/dL (ref 70–99)
Potassium: 4.2 mmol/L (ref 3.5–5.1)
Sodium: 136 mmol/L (ref 135–145)

## 2024-01-10 LAB — BETA-2-GLYCOPROTEIN I ABS, IGG/M/A
Beta-2 Glyco I IgG: <9 GPI IgG units (ref 0–20)
Beta-2-Glycoprotein I IgA: <9 GPI IgA units (ref 0–25)
Beta-2-Glycoprotein I IgM: <9 GPI IgM units (ref 0–32)

## 2024-01-10 LAB — PTT-LA MIX: PTT-LA Mix: 50.4 s — ABNORMAL HIGH (ref 0.0–40.5)

## 2024-01-10 LAB — PROTEIN C ACTIVITY: Protein C Activity: 71 % — ABNORMAL LOW (ref 73–180)

## 2024-01-10 LAB — LUPUS ANTICOAGULANT PANEL
DRVVT: 69.4 s — ABNORMAL HIGH (ref 0.0–47.0)
PTT Lupus Anticoagulant: 56.4 s — ABNORMAL HIGH (ref 0.0–43.5)

## 2024-01-10 LAB — CBC
HCT: 39.9 % (ref 39.0–52.0)
Hemoglobin: 13.4 g/dL (ref 13.0–17.0)
MCH: 28 pg (ref 26.0–34.0)
MCHC: 33.6 g/dL (ref 30.0–36.0)
MCV: 83.5 fL (ref 80.0–100.0)
Platelets: 246 K/uL (ref 150–400)
RBC: 4.78 MIL/uL (ref 4.22–5.81)
RDW: 13.1 % (ref 11.5–15.5)
WBC: 8.2 K/uL (ref 4.0–10.5)
nRBC: 0 % (ref 0.0–0.2)

## 2024-01-10 LAB — DRVVT CONFIRM: dRVVT Confirm: 1.2 ratio (ref 0.8–1.2)

## 2024-01-10 LAB — PROTEIN S ACTIVITY: Protein S Activity: 44 % — ABNORMAL LOW (ref 63–140)

## 2024-01-10 LAB — HEXAGONAL PHASE PHOSPHOLIPID: Hexagonal Phase Phospholipid: 6 s (ref 0–11)

## 2024-01-10 LAB — DRVVT MIX: dRVVT Mix: 48.8 s — ABNORMAL HIGH (ref 0.0–40.4)

## 2024-01-10 LAB — PROTEIN S, TOTAL: Protein S Ag, Total: 95 % (ref 60–150)

## 2024-01-10 MED ORDER — OXYCODONE HCL 5 MG PO TABS
5.0000 mg | ORAL_TABLET | Freq: Three times a day (TID) | ORAL | 0 refills | Status: DC | PRN
  Filled 2024-01-10: qty 10, 4d supply, fill #0

## 2024-01-10 MED ORDER — APIXABAN (ELIQUIS) VTE STARTER PACK (10MG AND 5MG)
ORAL_TABLET | ORAL | 0 refills | Status: DC
  Filled 2024-01-10: qty 74, 30d supply, fill #0

## 2024-01-10 NOTE — Discharge Summary (Addendum)
 Physician Discharge Summary   Patient: David [name] MRN: [id number] DOB: October 01, 2000  Admit date:     01/06/2024  Discharge date: 01/10/24  Discharge Physician: Deliliah Room   PCP: Bucio, Elsa C, FNP   Recommendations at discharge:    Follow up with PCP in on week. Follow up with neurology in one month Follow up with your rheumatologist at [facility] on the scheduled appointment.  Discharge Diagnoses: Principal Problem:   Dural venous sinus thrombosis Active Problems:   Mixed connective tissue disease [facility])   Hospital Course:  23 y.o. male with history of mixed connective tissue disorder and migraines presenting with 3 weeks of headache.  MRI extensive dural sinus thrombosis involving the superior sagittal sinus, left transverse sinus, and left sigmoid sinus. Cortical veins are patent. Left mastoid effusion. No obstructing nasopharyngeal lesion.  MRV dural venous sinus thrombosis involving the majority of the superior sagittal sinus and the dominant left transverse and sigmoid sinuses.  CTA with no LVO or hemodynamically significant stenosis 2D Echo & bubble EF 65-70%, bubble (-) no evidence of interarterial shunt Venous duplex no evidence of DVT bilaterally ECHO with bubble study is unremarkable.  Started on heparin  drip which was transitioned to eliquis  on 9/16. No evidence of bleeding. Advised to stop taking eliquis  if he develops bleeding from any site. Neurology was consulted  Hypercoagulopathy work up ordered. He was advised to follow up with hematology and neurology. He does have a rheumatologist at [facility].      Consultants: Neurology Procedures performed: None  Disposition: Home Diet recommendation:  Regular diet DISCHARGE MEDICATION: Allergies as of 01/10/2024       Reactions   Fish Allergy Itching   Pt states he is allergic to lobster         Medication List     STOP taking these medications    amoxicillin -clavulanate 875-125 MG tablet Commonly known as:  AUGMENTIN    naproxen  500 MG tablet Commonly known as: NAPROSYN        TAKE these medications    Eliquis  DVT/PE Starter Pack Generic drug: Apixaban  Starter Pack (10mg  and 5mg ) Take as directed on package: start with two-5mg  tablets twice daily for 7 days. On day 8, switch to one-5mg  tablet twice daily.   hydroxychloroquine  200 MG tablet Commonly known as: PLAQUENIL  Take 1 tablet by mouth daily.   mycophenolate  500 MG tablet Commonly known as: CELLCEPT  Take 1,000 mg by mouth 2 (two) times daily.   ondansetron  4 MG disintegrating tablet Commonly known as: ZOFRAN -ODT Take 1 tablet (4 mg total) by mouth every 8 (eight) hours as needed for nausea or vomiting.   oxyCODONE  5 MG immediate release tablet Commonly known as: Roxicodone  Take 1 tablet (5 mg total) by mouth every 8 (eight) hours as needed for severe pain (pain score 7-10).        Follow-up Information     [facility]. Schedule an appointment as soon as possible for a visit in 1 month(s).   Specialty: Neurology Why: stroke clinic Contact information: [address] [place]  [phone]        Bucio, Silvio BROCKS, FNP. Schedule an appointment as soon as possible for a visit in 1 week(s).   Specialty: Family Medicine Contact information: [address] [place] [address] [phone]         [facility] - A Dept Of [place]. Schedule an appointment as soon as possible for  a visit in 1 month(s).   Specialty: Oncology Contact information: [address] [place]  [address] [phone]               Discharge Exam: Filed Weights   01/06/24 1543  Weight: 69 kg   Constitutional: NAD, calm, comfortable Eyes: PERRL, lids and conjunctivae normal ENMT: Mucous membranes are moist. Posterior pharynx clear of any exudate or lesions.Normal dentition.  Neck: normal, supple, no masses, no  thyromegaly Respiratory: clear to auscultation bilaterally, no wheezing, no crackles. Normal respiratory effort. No accessory muscle use.  Cardiovascular: Regular rate and rhythm, no murmurs / rubs / gallops. No extremity edema. 2+ pedal pulses. No carotid bruits.  Abdomen: no tenderness, no masses palpated. No hepatosplenomegaly. Bowel sounds positive.  Musculoskeletal: no clubbing / cyanosis. No joint deformity upper and lower extremities. Good ROM, no contractures. Normal muscle tone.  Skin: no rashes, lesions, ulcers. No induration Neurologic: CN 2-12 grossly intact. Sensation intact, DTR normal. Strength 5/5 x all 4 extremities.  Psychiatric: Normal judgment and insight. Alert and oriented x 3. Normal mood.    Condition at discharge: good  The results of significant diagnostics from this hospitalization (including imaging, microbiology, ancillary and laboratory) are listed below for reference.   Imaging Studies: CT ANGIO HEAD NECK W WO CM Result Date: 01/09/2024 CLINICAL DATA:  Neuro deficit, acute, stroke suspected EXAM: CT ANGIOGRAPHY HEAD AND NECK WITH AND WITHOUT CONTRAST TECHNIQUE: Multidetector CT imaging of the head and neck was performed using the standard protocol during bolus administration of intravenous contrast. Multiplanar CT image reconstructions and MIPs were obtained to evaluate the vascular anatomy. Carotid stenosis measurements (when applicable) are obtained utilizing NASCET criteria, using the distal internal carotid diameter as the denominator. RADIATION DOSE REDUCTION: This exam was performed according to the departmental dose-optimization program which includes automated exposure control, adjustment of the mA and/or kV according to patient size and/or use of iterative reconstruction technique. CONTRAST:  60mL OMNIPAQUE  IOHEXOL  350 MG/ML SOLN COMPARISON:  MRI head 01/06/2024. FINDINGS: CT HEAD FINDINGS Brain: No evidence of acute infarction, hemorrhage, hydrocephalus, or  mass lesion. Suspected 4 mm thick hyperdense subdural fluid collection along the under surface of the left tentorial leaflet, likely similar to recent MRI. No substantial mass effect. Vascular: See below. Skull: No acute fracture. Sinuses/Orbits: No acute finding. Review of the MIP images confirms the above findings CTA NECK FINDINGS Aortic arch: Great vessel origins are patent without significant stenosis. Right carotid system: No evidence of dissection, stenosis (50% or greater), or occlusion. Left carotid system: No evidence of dissection, stenosis (50% or greater), or occlusion. Vertebral arteries: Codominant. No evidence of dissection, stenosis (50% or greater), or occlusion. Skeleton: No acute fracture. Other neck: No acute abnormality on limited assessment. Upper chest: Lung apices are clear. Review of the MIP images confirms the above findings CTA HEAD FINDINGS Anterior circulation: Bilateral intracranial ICAs, MCAs, and ACAs are patent without proximal hemodynamically significant stenosis. No aneurysm identified. Posterior circulation: Bilateral intradural vertebral arteries, basilar artery and bilateral posterior rods are patent without proximal hemodynamically significant stenosis. No aneurysm identified. Venous sinuses: No well evaluated. Known extensive dural venous sinus thrombosis better seen on recent MRdonV. Review of the MIP images confirms the above findings IMPRESSION: 1. No large vessel arterial occlusion or proximal hemodynamically significant stenosis. 2. Suspected 4 mm thick hypodense subdural fluid collection along the under surface of the left tentorial leaflet, likely similar to recent MRI. No substantial mass effect. 3. Known extensive dural venous sinus thrombosis  better characterized on recent MRV. Electronically Signed   By: Gilmore GORMAN Molt M.D.   On: 01/09/2024 02:09   ECHOCARDIOGRAM COMPLETE BUBBLE STUDY Result Date: 01/08/2024    ECHOCARDIOGRAM REPORT   Patient Name:   BURLEIGH BROCKMANN Date of Exam: 01/08/2024 Medical Rec #:  [id number]   Height:       71.0 in Accession #:    [id number]  Weight:       152.1 lb Date of Birth:  [birth year]  BSA:          1.877 m Patient Age:    22 years    BP:           136/98 mmHg Patient Gender: M           HR:           72 bpm. Exam Location:  Inpatient Procedure: 2D Echo, Cardiac Doppler and Color Doppler (Both Spectral and Color            Flow Doppler were utilized during procedure). Indications:    Dural venous sinus thrombosis.  History:        Patient has prior history of Echocardiogram examinations, most                 recent 12/05/2021. Signs/Symptoms:Chest Pain and Fever.                 Connective tissue disorder. Pericardial effusion.  Sonographer:    Ellouise Mose RDCS Referring Phys: [id number] DEVON SHAFER IMPRESSIONS  1. Left ventricular ejection fraction, by estimation, is 65 to 70%. The left ventricle has normal function. The left ventricle has no regional wall motion abnormalities. Left ventricular diastolic parameters were normal.  2. Right ventricular systolic function is normal. The right ventricular size is normal.  3. The mitral valve is normal in structure. Trivial mitral valve regurgitation. No evidence of mitral stenosis.  4. The aortic valve is tricuspid. Aortic valve regurgitation is not visualized. No aortic stenosis is present.  5. Agitated saline contrast bubble study was negative, with no evidence of any interatrial shunt. FINDINGS  Left Ventricle: Left ventricular ejection fraction, by estimation, is 65 to 70%. The left ventricle has normal function. The left ventricle has no regional wall motion abnormalities. The left ventricular internal cavity size was normal in size. There is  no left ventricular hypertrophy. Left ventricular diastolic parameters were normal. Right Ventricle: The right ventricular size is normal. No increase in right ventricular wall thickness. Right ventricular systolic function is normal. Left Atrium: Left  atrial size was normal in size. Right Atrium: Right atrial size was normal in size. Pericardium: There is no evidence of pericardial effusion. Mitral Valve: The mitral valve is normal in structure. Trivial mitral valve regurgitation. No evidence of mitral valve stenosis. Tricuspid Valve: The tricuspid valve is normal in structure. Tricuspid valve regurgitation is trivial. No evidence of tricuspid stenosis. Aortic Valve: The aortic valve is tricuspid. Aortic valve regurgitation is not visualized. No aortic stenosis is present. Pulmonic Valve: The pulmonic valve was normal in structure. Pulmonic valve regurgitation is not visualized. No evidence of pulmonic stenosis. Aorta: The aortic root and ascending aorta are structurally normal, with no evidence of dilitation. IAS/Shunts: No atrial level shunt detected by color flow Doppler. Agitated saline contrast bubble study was negative, with no evidence of any interatrial shunt.  LEFT VENTRICLE PLAX 2D LVIDd:         4.20 cm     Diastology LVIDs:  2.50 cm     LV e' medial:    9.99 cm/s LV PW:         0.90 cm     LV E/e' medial:  6.9 LV IVS:        1.10 cm     LV e' lateral:   16.40 cm/s LVOT diam:     2.30 cm     LV E/e' lateral: 4.2 LV SV:         78 LV SV Index:   42 LVOT Area:     4.15 cm  LV Volumes (MOD) LV vol d, MOD A2C: 66.3 ml LV vol d, MOD A4C: 89.1 ml LV vol s, MOD A2C: 27.0 ml LV vol s, MOD A4C: 29.6 ml LV SV MOD A2C:     39.3 ml LV SV MOD A4C:     89.1 ml LV SV MOD BP:      50.0 ml RIGHT VENTRICLE             IVC RV S prime:     11.50 cm/s  IVC diam: 0.90 cm TAPSE (M-mode): 1.7 cm LEFT ATRIUM             Index        RIGHT ATRIUM           Index LA diam:        3.50 cm 1.86 cm/m   RA Area:     12.40 cm LA Vol (A2C):   25.0 ml 13.32 ml/m  RA Volume:   27.20 ml  14.49 ml/m LA Vol (A4C):   31.4 ml 16.73 ml/m LA Biplane Vol: 30.7 ml 16.36 ml/m  AORTIC VALVE LVOT Vmax:   99.60 cm/s LVOT Vmean:  64.500 cm/s LVOT VTI:    0.188 m  AORTA Ao Root diam: 3.30  cm MITRAL VALVE MV Area (PHT): 3.68 cm    SHUNTS MV Decel Time: 206 msec    Systemic VTI:  0.19 m MV E velocity: 68.90 cm/s  Systemic Diam: 2.30 cm MV A velocity: 53.95 cm/s MV E/A ratio:  1.28 Aditya Sabharwal Electronically signed by Ria Commander Signature Date/Time: 01/08/2024/1:15:43 PM    Final    VAS US  LOWER EXTREMITY VENOUS (DVT) Result Date: 01/08/2024  Lower Venous DVT Study Patient Name:  DAYVEN LINSLEY  Date of Exam:   01/08/2024 Medical Rec #: [id number]    Accession #:    [id number] Date of Birth: Jun 12, 2000   Patient Gender: M Patient Age:   56 years Exam Location:  [facility] Procedure:      VAS US  LOWER EXTREMITY VENOUS (DVT) Referring Phys: JORENE LAST --------------------------------------------------------------------------------  Indications: Dural venous sinus thrombosis.  Comparison Study: Prevviosu study on 8.13.2023. Performing Technologist: Edilia Elden Appl  Examination Guidelines: A complete evaluation includes B-mode imaging, spectral Doppler, color Doppler, and power Doppler as needed of all accessible portions of each vessel. Bilateral testing is considered an integral part of a complete examination. Limited examinations for reoccurring indications may be performed as noted. The reflux portion of the exam is performed with the patient in reverse Trendelenburg.  +---------+---------------+---------+-----------+----------+--------------+ RIGHT    CompressibilityPhasicitySpontaneityPropertiesThrombus Aging +---------+---------------+---------+-----------+----------+--------------+ CFV      Full           Yes      Yes                                 +---------+---------------+---------+-----------+----------+--------------+ SFJ  Full           Yes      Yes                                 +---------+---------------+---------+-----------+----------+--------------+ FV Prox  Full                                                         +---------+---------------+---------+-----------+----------+--------------+ FV Mid   Full                                                        +---------+---------------+---------+-----------+----------+--------------+ FV DistalFull                                                        +---------+---------------+---------+-----------+----------+--------------+ PFV      Full                                                        +---------+---------------+---------+-----------+----------+--------------+ POP      Full           Yes      Yes                                 +---------+---------------+---------+-----------+----------+--------------+ PTV      Full                                                        +---------+---------------+---------+-----------+----------+--------------+ PERO     Full                                                        +---------+---------------+---------+-----------+----------+--------------+   +---------+---------------+---------+-----------+----------+--------------+ LEFT     CompressibilityPhasicitySpontaneityPropertiesThrombus Aging +---------+---------------+---------+-----------+----------+--------------+ CFV      Full           Yes      Yes                                 +---------+---------------+---------+-----------+----------+--------------+ SFJ      Full           Yes      Yes                                 +---------+---------------+---------+-----------+----------+--------------+  FV Prox  Full                                                        +---------+---------------+---------+-----------+----------+--------------+ FV Mid   Full                                                        +---------+---------------+---------+-----------+----------+--------------+ FV DistalFull                                                         +---------+---------------+---------+-----------+----------+--------------+ PFV      Full                                                        +---------+---------------+---------+-----------+----------+--------------+ POP      Full           Yes      Yes                                 +---------+---------------+---------+-----------+----------+--------------+ PTV      Full                                                        +---------+---------------+---------+-----------+----------+--------------+ PERO     Full                                                        +---------+---------------+---------+-----------+----------+--------------+     Summary: BILATERAL: - No evidence of deep vein thrombosis seen in the lower extremities, bilaterally. -No evidence of popliteal cyst, bilaterally.   *See table(s) above for measurements and observations. Electronically signed by Debby Robertson on 01/08/2024 at 11:36:18 AM.    Final    MR MRV HEAD W WO CONTRAST Result Date: 01/06/2024 EXAM: MR Brain without and with Intravenous Contrast. CLINICAL HISTORY: Dural venous sinus thrombosis suspected. Migraine headache history. Severe pain in head for days, nausea and vomiting as well. TECHNIQUE: Magnetic resonance images of the brain without and with intravenous contrast in multiple planes. CONTRAST: 7 mL Gadavist  (gadobutrol  (GADAVIST ) 1 MMOL/ML injection 7 mL GADOBUTROL  1 MMOL/ML IV SOLN). COMPARISON: None provided. FINDINGS: BRAIN: No restricted diffusion to indicate acute infarction. No intracranial mass or hemorrhage. No midline shift or extra-axial fluid collection. No cerebellar tonsillar ectopia. VENTRICLES: No hydrocephalus. ORBITS: The orbits are normal. SINUSES AND MASTOIDS: The sinuses and mastoid air cells are clear. BONES: No acute fracture or focal osseous lesion.  VASCULATURE: No flow is present in the majority of the superior sagittal sinus and the dominant left transverse sinus.  Left sigmoid sinus flow is present in the right transverse and sigmoid sinus. Cortical veins are patent. Anterior drainage pathways are patent bilaterally. IMPRESSION: 1. 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 Critical findings were called to Sherran Barks at 3:06 pm Electronically signed by: Lonni Necessary MD 01/06/2024 03:09 PM EDT RP Workstation: [id number]   MR BRAIN WO CONTRAST Result Date: 01/06/2024 EXAM: MRI BRAIN WITHOUT CONTRAST 01/06/2024 01:53:49 PM TECHNIQUE: Multiplanar multisequence MRI of the head/brain was performed without the administration of intravenous contrast. COMPARISON: None available. CLINICAL HISTORY: Headache, increasing frequency or severity. MR Brain w/o; Migraine headache hx. Severe pain in head for days, nausea and vomiting as well. FINDINGS: BRAIN AND VENTRICLES: No acute infarct. No intracranial hemorrhage. No mass. No midline shift. No hydrocephalus. The sella is unremarkable. Abnormal signal is present in the superior sagittal sinus, left transverse sinus and left sigmoid sinus consistent with extensive dural sinus thrombosis. Cortical veins are patent. ORBITS: No acute abnormality. SINUSES AND MASTOIDS: A left mastoid effusion is present. No obstructing nasopharyngeal lesion is present. BONES AND SOFT TISSUES: Normal marrow signal. No acute soft tissue abnormality. IMPRESSION: 1. Extensive dural sinus thrombosis involving the superior sagittal sinus, left transverse sinus, and left sigmoid sinus. Cortical veins are patent. 2. Left mastoid effusion. No obstructing nasopharyngeal lesion. Critical findings were called to Sherran Barks at 3:06 pm Electronically signed by: Lonni Necessary MD 01/06/2024 03:07 PM EDT RP Workstation: [id number]    Microbiology: Results for orders placed or performed during the hospital encounter of 01/01/24  Resp panel by RT-PCR (RSV,  Flu A&B, Covid) Anterior Nasal Swab     Status: None   Collection Time: 01/01/24  8:40 AM   Specimen: Anterior Nasal Swab  Result Value Ref Range Status   SARS Coronavirus 2 by RT PCR NEGATIVE NEGATIVE Final    Comment: (NOTE) SARS-CoV-2 target nucleic acids are NOT DETECTED.  The SARS-CoV-2 RNA is generally detectable in upper respiratory specimens during the acute phase of infection. The lowest concentration of SARS-CoV-2 viral copies this assay can detect is 138 copies/mL. A negative result does not preclude SARS-Cov-2 infection and should not be used as the sole basis for treatment or other patient management decisions. A negative result may occur with  improper specimen collection/handling, submission of specimen other than nasopharyngeal swab, presence of viral mutation(s) within the areas targeted by this assay, and inadequate number of viral copies(<138 copies/mL). A negative result must be combined with clinical observations, patient history, and epidemiological information. The expected result is Negative.  Fact Sheet for Patients:  BloggerCourse.com  Fact Sheet for Healthcare Providers:  SeriousBroker.it  This test is no t yet approved or cleared by the [place]  FDA and  has been authorized for detection and/or diagnosis of SARS-CoV-2 by FDA under an Emergency Use Authorization (EUA). This EUA will remain  in effect (meaning this test can be used) for the duration of the COVID-19 declaration under Section 564(b)(1) of the Act, 21 U.S.C.section 360bbb-3(b)(1), unless the authorization is terminated  or revoked sooner.       Influenza A by PCR NEGATIVE NEGATIVE Final   Influenza B by PCR NEGATIVE NEGATIVE Final    Comment: (NOTE) The Xpert Xpress SARS-CoV-2/FLU/RSV plus assay is intended as an aid in the diagnosis of influenza from Nasopharyngeal swab specimens and  should not be used as a sole basis for  treatment. Nasal washings and aspirates are unacceptable for Xpert Xpress SARS-CoV-2/FLU/RSV testing.  Fact Sheet for Patients: BloggerCourse.com  Fact Sheet for Healthcare Providers: SeriousBroker.it  This test is not yet approved or cleared by the [place]  FDA and has been authorized for detection and/or diagnosis of SARS-CoV-2 by FDA under an Emergency Use Authorization (EUA). This EUA will remain in effect (meaning this test can be used) for the duration of the COVID-19 declaration under Section 564(b)(1) of the Act, 21 U.S.C. section 360bbb-3(b)(1), unless the authorization is terminated or revoked.     Resp Syncytial Virus by PCR NEGATIVE NEGATIVE Final    Comment: (NOTE) Fact Sheet for Patients: BloggerCourse.com  Fact Sheet for Healthcare Providers: SeriousBroker.it  This test is not yet approved or cleared by the [place]  FDA and has been authorized for detection and/or diagnosis of SARS-CoV-2 by FDA under an Emergency Use Authorization (EUA). This EUA will remain in effect (meaning this test can be used) for the duration of the COVID-19 declaration under Section 564(b)(1) of the Act, 21 U.S.C. section 360bbb-3(b)(1), unless the authorization is terminated or revoked.  Performed at [facility], [address], [place], [place] [address]   Group A Strep by PCR     Status: None   Collection Time: 01/01/24  8:40 AM   Specimen: Anterior Nasal Swab; Sterile Swab  Result Value Ref Range Status   Group A Strep by PCR NOT DETECTED NOT DETECTED Final    Comment: Performed at [facility], [address], [place], [place] [address]    Labs: CBC: Recent Labs  Lab 01/06/24 1236 01/07/24 0803 01/08/24 0630 01/09/24 0511 01/10/24 0219  WBC 8.4 8.5 9.7 8.9 8.2  NEUTROABS 6.0  --   --   --   --   HGB 13.7 13.1 13.5 12.9* 13.4  HCT 41.2 38.4* 40.1 38.2* 39.9   MCV 87.7 85.5 84.8 84.7 83.5  PLT 284 272 285 251 246   Basic Metabolic Panel: Recent Labs  Lab 01/06/24 1236 01/07/24 0803 01/09/24 0511 01/10/24 0219  NA 137 135 135 136  K 4.4 3.9 3.8 4.2  CL 101 100 102 103  CO2 26 25 24 24   GLUCOSE 84 113* 105* 87  BUN 7 7 <5* <5*  CREATININE 0.67 0.61 0.62 0.71  CALCIUM 9.1 8.8* 8.9 9.4   Liver Function Tests: No results for input(s): AST, ALT, ALKPHOS, BILITOT, PROT, ALBUMIN  in the last 168 hours. CBG: No results for input(s): GLUCAP in the last 168 hours.  Discharge time spent:43 minutes.  Signed: Deliliah Room, MD [facility] 01/10/2024

## 2024-01-10 NOTE — Progress Notes (Signed)
 Patient went home with his friend,  IV and telemetry discontinued.  Vital signs unremarkable,  AVS discussed.  Patient is alert, oriented denies any pain upon discharge.

## 2024-01-11 ENCOUNTER — Encounter (HOSPITAL_COMMUNITY): Payer: Self-pay

## 2024-01-11 LAB — PROTEIN C, TOTAL: Protein C, Total: 100 % (ref 60–150)

## 2024-01-12 LAB — CARDIOLIPIN ANTIBODIES, IGG, IGM, IGA
Anticardiolipin IgA: <9 U/mL (ref 0–11)
Anticardiolipin IgG: 12 GPL U/mL (ref 0–14)
Anticardiolipin IgM: <9 [MPL'U]/mL (ref 0–12)

## 2024-01-12 LAB — FACTOR 5 LEIDEN

## 2024-01-12 LAB — PROTHROMBIN GENE MUTATION

## 2024-01-15 ENCOUNTER — Other Ambulatory Visit (HOSPITAL_COMMUNITY): Payer: Self-pay

## 2024-01-29 ENCOUNTER — Telehealth: Payer: Self-pay | Admitting: Family Medicine

## 2024-01-29 NOTE — Telephone Encounter (Signed)
 I RETURNED David [name]'S CALL FROM A VM RECEIVED ON 9/30. David [name] WOULD LIKE TO SCHEDULE AN APPOINTMENT, I INFORMED HIM THAT THERE WAS NOT A REFERRAL PLACED HERE AND THAT HE COULD HAVE ONE PLACED BY HIS PCP IF NEEDED.

## 2024-02-17 ENCOUNTER — Encounter: Payer: Self-pay | Admitting: Diagnostic Neuroimaging

## 2024-02-18 ENCOUNTER — Emergency Department (HOSPITAL_COMMUNITY)

## 2024-02-18 ENCOUNTER — Emergency Department (HOSPITAL_COMMUNITY)
Admission: EM | Admit: 2024-02-18 | Discharge: 2024-02-18 | Disposition: A | Discharge status: Home / Self Care | Attending: Emergency Medicine | Admitting: Emergency Medicine

## 2024-02-18 ENCOUNTER — Encounter (HOSPITAL_COMMUNITY): Payer: Self-pay | Admitting: Emergency Medicine

## 2024-02-18 DIAGNOSIS — Z760 Encounter for issue of repeat prescription: Secondary | ICD-10-CM | POA: Insufficient documentation

## 2024-02-18 DIAGNOSIS — R519 Headache, unspecified: Secondary | ICD-10-CM | POA: Insufficient documentation

## 2024-02-18 DIAGNOSIS — Z7901 Long term (current) use of anticoagulants: Secondary | ICD-10-CM | POA: Diagnosis not present

## 2024-02-18 LAB — CBC WITH DIFFERENTIAL/PLATELET
Abs Immature Granulocytes: 0.03 K/uL (ref 0.00–0.07)
Basophils Absolute: 0 K/uL (ref 0.0–0.1)
Basophils Relative: 0 %
Eosinophils Absolute: 0 K/uL (ref 0.0–0.5)
Eosinophils Relative: 0 %
HCT: 45.5 % (ref 39.0–52.0)
Hemoglobin: 15.4 g/dL (ref 13.0–17.0)
Immature Granulocytes: 0 %
Lymphocytes Relative: 24 %
Lymphs Abs: 2.5 K/uL (ref 0.7–4.0)
MCH: 29.2 pg (ref 26.0–34.0)
MCHC: 33.8 g/dL (ref 30.0–36.0)
MCV: 86.3 fL (ref 80.0–100.0)
Monocytes Absolute: 0.9 K/uL (ref 0.1–1.0)
Monocytes Relative: 8 %
Neutro Abs: 6.8 K/uL (ref 1.7–7.7)
Neutrophils Relative %: 68 %
Platelets: 244 K/uL (ref 150–400)
RBC: 5.27 MIL/uL (ref 4.22–5.81)
RDW: 15 % (ref 11.5–15.5)
WBC: 10.2 K/uL (ref 4.0–10.5)
nRBC: 0 % (ref 0.0–0.2)

## 2024-02-18 LAB — COMPREHENSIVE METABOLIC PANEL WITH GFR
ALT: 18 U/L (ref 0–44)
AST: 29 U/L (ref 15–41)
Albumin: 3.9 g/dL (ref 3.5–5.0)
Alkaline Phosphatase: 84 U/L (ref 38–126)
Anion gap: 12 (ref 5–15)
BUN: 5 mg/dL — ABNORMAL LOW (ref 6–20)
CO2: 25 mmol/L (ref 22–32)
Calcium: 9.2 mg/dL (ref 8.9–10.3)
Chloride: 98 mmol/L (ref 98–111)
Creatinine, Ser: 0.75 mg/dL (ref 0.61–1.24)
GFR, Estimated: >60 mL/min (ref >60)
Glucose, Bld: 103 mg/dL — ABNORMAL HIGH (ref 70–99)
Potassium: 4.1 mmol/L (ref 3.5–5.1)
Sodium: 135 mmol/L (ref 135–145)
Total Bilirubin: 0.9 mg/dL (ref 0.0–1.2)
Total Protein: 8.5 g/dL — ABNORMAL HIGH (ref 6.5–8.1)

## 2024-02-18 LAB — PROTIME-INR
INR: 1 (ref 0.8–1.2)
Prothrombin Time: 13.8 s (ref 11.4–15.2)

## 2024-02-18 LAB — APTT: aPTT: 29 s (ref 24–36)

## 2024-02-18 MED ORDER — IOHEXOL 350 MG/ML SOLN
75.0000 mL | Freq: Once | INTRAVENOUS | Status: AC | PRN
  Administered 2024-02-18: 75 mL via INTRAVENOUS

## 2024-02-18 MED ORDER — OXYCODONE HCL 5 MG PO TABS: 5.0000 mg | ORAL_TABLET | ORAL | 0 refills | Status: DC | PRN

## 2024-02-18 MED ORDER — ONDANSETRON HCL 4 MG/2ML IJ SOLN
4.0000 mg | Freq: Once | INTRAMUSCULAR | Status: AC
  Administered 2024-02-18: 4 mg via INTRAVENOUS
  Filled 2024-02-18: qty 2

## 2024-02-18 MED ORDER — APIXABAN (ELIQUIS) VTE STARTER PACK (10MG AND 5MG)
ORAL_TABLET | ORAL | 0 refills | Status: DC
Start: 2024-02-18 — End: 2024-02-18

## 2024-02-18 MED ORDER — APIXABAN 5 MG PO TABS: 5.0000 mg | ORAL_TABLET | Freq: Two times a day (BID) | ORAL | Status: DC

## 2024-02-18 MED ORDER — APIXABAN 5 MG PO TABS: 10.0000 mg | ORAL_TABLET | Freq: Once | ORAL | Status: DC

## 2024-02-18 MED ORDER — APIXABAN 5 MG PO TABS: 10.0000 mg | ORAL_TABLET | Freq: Two times a day (BID) | ORAL | Status: DC

## 2024-02-18 MED ORDER — MORPHINE SULFATE (PF) 2 MG/ML IV SOLN
2.0000 mg | Freq: Once | INTRAVENOUS | Status: AC
  Administered 2024-02-18: 2 mg via INTRAVENOUS
  Filled 2024-02-18: qty 1

## 2024-02-18 MED ORDER — APIXABAN 5 MG PO TABS
10.0000 mg | ORAL_TABLET | Freq: Once | ORAL | Status: AC
  Administered 2024-02-18: 10 mg via ORAL
  Filled 2024-02-18: qty 2

## 2024-02-18 MED ORDER — APIXABAN (ELIQUIS) VTE STARTER PACK (10MG AND 5MG): ORAL_TABLET | ORAL | 0 refills | Status: DC

## 2024-02-18 NOTE — ED Provider Triage Note (Signed)
 Emergency Medicine Provider Triage Evaluation Note  Noa Constante , a 23 y.o. male  was evaluated in triage.  Pt complains of persistent worsening headache over the past week.  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  Reports that he has been out of Eliquis  for the past 2 weeks.  Endorses blurry vision with associated nausea.  Review of Systems  Positive:  Negative:   Physical Exam  BP (!) 147/99 (BP Location: Right Arm)   Pulse 78   Temp 98 F (36.7 C) (Oral)   Resp 14   SpO2 99%  Gen:   Awake, no distress   Resp:  Normal effort  MSK:   Moves extremities without difficulty  Other:  No gross neurodeficits on exam  Medical Decision Making  Medically screening exam initiated at 8:35 AM.  Appropriate orders placed.  Saiquan Christofferson was informed that the remainder of the evaluation will be completed by another provider, this initial triage assessment does not replace that evaluation, and the importance of remaining in the ED until their evaluation is complete.  Will obtain routine labs, neuro consulted for preferred imaging   Donnajean Lynwood VEAR DEVONNA 02/18/24 9163

## 2024-02-18 NOTE — ED Triage Notes (Signed)
 Pt here from home with c/o headache times 4 days , pt was placed  on a blood thinner due to a mass /clot found in his head , pt ran out of meds 2 weeks ago

## 2024-02-18 NOTE — ED Provider Notes (Addendum)
 [facility]GENC[facility] DEPARTMENT AT [facility] Provider Note   CSN: [id number] Arrival date & time: 02/18/24  9248     Patient presents with: Headache   David [name] is a 23 y.o. male.   23 year old male with prior medical history as detailed below presents for evaluation.  Patient was diagnosed with dural venous sinus thrombosis.  He was on Eliquis .  He ran out of his Eliquis  2 weeks ago.  He has been unable to obtain a refill on his Eliquis .  He complains now of persistent headache -present for more than 2 weeks.  He reports some mild intermittent blurry vision.  He denies current visual disturbance.  He has some mild nausea.  He denies focal weakness.  He denies fever.  He denies any seizure-like episode.  The history is provided by the patient and medical records.       Prior to Admission medications   Medication Sig Start Date End Date Taking? Authorizing Provider  APIXABAN  (ELIQUIS ) VTE STARTER PACK (10MG  AND 5MG ) Take as directed on package: start with two-5mg  tablets twice daily for 7 days. On day 8, switch to one-5mg  tablet twice daily. 01/10/24   Rashid, Farhan, MD  mycophenolate  (CELLCEPT ) 500 MG tablet Take 1,000 mg by mouth 2 (two) times daily. 02/13/23   [provider]  ondansetron  (ZOFRAN -ODT) 4 MG disintegrating tablet Take 1 tablet (4 mg total) by mouth every 8 (eight) hours as needed for nausea or vomiting. 01/03/24   Rolinda Rogue, MD  oxyCODONE  (ROXICODONE ) 5 MG immediate release tablet Take 1 tablet (5 mg total) by mouth every 8 (eight) hours as needed for severe pain (pain score 7-10). 01/10/24 01/09/25  Dino Antu, MD    Allergies: Fish allergy    Review of Systems  All other systems reviewed and are negative.   Updated Vital Signs BP (!) 147/99 (BP Location: Right Arm)   Pulse 78   Temp 98 F (36.7 C) (Oral)   Resp 14   SpO2 99%   Physical Exam Vitals and nursing note reviewed.  Constitutional:      General: He is not in acute  distress.    Appearance: Normal appearance. He is well-developed.  HENT:     Head: Normocephalic and atraumatic.  Eyes:     Conjunctiva/sclera: Conjunctivae normal.     Pupils: Pupils are equal, round, and reactive to light.  Cardiovascular:     Rate and Rhythm: Normal rate and regular rhythm.     Heart sounds: Normal heart sounds.  Pulmonary:     Effort: Pulmonary effort is normal. No respiratory distress.     Breath sounds: Normal breath sounds.  Abdominal:     General: There is no distension.     Palpations: Abdomen is soft.     Tenderness: There is no abdominal tenderness.  Musculoskeletal:        General: No deformity. Normal range of motion.     Cervical back: Normal range of motion and neck supple.  Skin:    General: Skin is warm and dry.  Neurological:     General: No focal deficit present.     Mental Status: He is alert and oriented to person, place, and time.     GCS: GCS eye subscore is 4. GCS verbal subscore is 5. GCS motor subscore is 6.     Cranial Nerves: No cranial nerve deficit, dysarthria or facial asymmetry.     Sensory: No sensory deficit.     Motor: No weakness.  Gait: Gait normal.     (all labs ordered are listed, but only abnormal results are displayed) Labs Reviewed  COMPREHENSIVE METABOLIC PANEL WITH GFR - Abnormal; Notable for the following components:      Result Value   Glucose, Bld 103 (*)    BUN 5 (*)    Total Protein 8.5 (*)    All other components within normal limits  CBC WITH DIFFERENTIAL/PLATELET  PROTIME-INR  APTT    EKG: None  Radiology: CT VENOGRAM HEAD Result Date: 02/18/2024 EXAM: CT VENOGRAM WITH CONTRAST 02/18/2024 11:06:45 AM TECHNIQUE: CT venogram of the head/brain was performed with the administration of 75 mL of iohexol  (OMNIPAQUE ) 350 MG/ML injection. Multiplanar reformatted images are provided for review. MIP images are provided for review. Automated exposure control, iterative reconstruction, and/or weight based  adjustment of the mA/kV was utilized to reduce the radiation dose to as low as reasonably achievable. COMPARISON: Head CT 01/08/2024. Intracranial MRV 01/06/2024. CTA head 01/08/2024. CLINICAL HISTORY: 23 year old male with headache for 4 days. History of a mass/clot in the head, previously on blood thinner, ran out 2 weeks ago. Dural venous sinus thrombosis suspected. FINDINGS: BRAIN/VENTRICLES: No acute intracranial hemorrhage. No extra axial fluid collection. Gray-white differentiation is maintained. No mass effect or midline shift. No hydrocephalus. ORBITS: No acute abnormality. SINUSES AND MASTOIDS: No acute abnormality. SOFT TISSUES AND SKULL: No acute abnormality. CT VENOGRAM: Pre-contrast, abnormal hyperdensity persists in the superior sagittal sinus (coronal image 33 of series 5). The left transverse sinus appears more normal prior to contrast. But Following contrast, there is a persistent filling defect and nonenhancement throughout most of the superior sagittal sinus; only the most anterior portion appears to be enhancing. Clot continues to the torcula. Low density clot also continues in the left transverse sinus, although it is no longer distended. The left sigmoid sinus and internal jugular bulb are heterogeneously enhancing, suggestive of subtotal occlusion (series 10 image 32). The straight sinus, vein of Galen, internal cerebral veins, and hypertrophied bilateral basal veins of Rosenthal are enhancing. The diminutive inferior sagittal sinus is enhancing. The right transverse and right sigmoid sinus, and right internal jugular bulb appear grossly patent. The cavernous sinus is enhancing and patent. Visible major arterial structures are enhancing as expected. IMPRESSION: 1. Unresolved and extensive dural venous sinus thrombosis involving most of the superior sagittal sinus, the torcula, and left transverse sinus. Partial occlusion of left sigmoid sinus and left internal jugular bulb. Deep venous  structures somewhat hypertrophied. 2. Negative CT oappearance of the brain parenchyma; no new intracranial abnormality. Electronically signed by: Helayne Hurst MD 02/18/2024 11:20 AM EDT RP Workstation: [id number]     Procedures   Medications Ordered in the ED  iohexol  (OMNIPAQUE ) 350 MG/ML injection 75 mL (75 mLs Intravenous Contrast Given 02/18/24 1058)                                    Medical Decision Making Patient with known history of dural sinus venous thrombosis.  Patient reports that he has been out of his Eliquis  x 2 weeks.  He reports chronic headaches every day.  He is requesting refill on his Eliquis  and also the oxycodone  that he has been using for pain control.  Patient is neurologically intact.  CT imaging suggest continued presence of dural sinus venous thrombosis.  Case discussed briefly with Dr. Khaliqdina, neuro, who does not feel the patient requires additional workup given that he  is neurologically intact.  Patient should be started back on his Eliquis .  No indication for admission for anticoagulation.  Appropriate Eliquis  dosing would be 10 mg twice daily x 7 days and then 5 mg twice daily thereafter.  Patient educated regarding how to manage his symptoms and how to treat his underlying pathology.  Patient is strongly encouraged to take his Eliquis  as prescribed until told to stop.  Patient educated that failure to comply with treatment recommendations could result in significant morbidity and mentality (seizure, stroke, death).  Importance of close follow-up is stressed.  Strict return precautions given and understood.  Risk Prescription drug management.        Final diagnoses:  Nonintractable headache, unspecified chronicity pattern, unspecified headache type    ED Discharge Orders          Ordered    APIXABAN  (ELIQUIS ) VTE STARTER PACK (10MG  AND 5MG )       Note to Pharmacy: If starter pack unavailable, substitute with seventy-four 5 mg apixaban  tabs  following the above SIG directions.   02/18/24 1210    oxyCODONE  (ROXICODONE ) 5 MG immediate release tablet  Every 4 hours PRN        02/18/24 1212               Laurice Maude BROCKS, MD 02/18/24 1313    Laurice Maude BROCKS, MD 02/18/24 1324

## 2024-02-18 NOTE — Discharge Instructions (Signed)
 Return for any.   Take your Eliquis  EVERY day as instructed.

## 2024-02-28 ENCOUNTER — Encounter: Payer: Self-pay | Admitting: Diagnostic Neuroimaging

## 2024-02-28 ENCOUNTER — Ambulatory Visit: Admitting: Diagnostic Neuroimaging

## 2024-02-28 VITALS — BP 122/81 | HR 91 | Ht 71.0 in | Wt 145.0 lb

## 2024-02-28 DIAGNOSIS — G08 Intracranial and intraspinal phlebitis and thrombophlebitis: Secondary | ICD-10-CM

## 2024-02-28 DIAGNOSIS — G43109 Migraine with aura, not intractable, without status migrainosus: Secondary | ICD-10-CM

## 2024-02-28 DIAGNOSIS — M351 Other overlap syndromes: Secondary | ICD-10-CM

## 2024-02-28 MED ORDER — ONDANSETRON 4 MG PO TBDP: 4.0000 mg | ORAL_TABLET | Freq: Three times a day (TID) | ORAL | 1 refills | Status: AC | PRN

## 2024-02-28 MED ORDER — NURTEC 75 MG PO TBDP
75.0000 mg | ORAL_TABLET | Freq: Every day | ORAL | 6 refills | Status: AC | PRN
Start: 2024-02-28 — End: ?

## 2024-02-28 MED ORDER — ELIQUIS 5 MG PO TABS: 5.0000 mg | ORAL_TABLET | Freq: Two times a day (BID) | ORAL | 12 refills | Status: AC

## 2024-02-28 MED ORDER — QULIPTA 60 MG PO TABS: 60.0000 mg | ORAL_TABLET | Freq: Every day | ORAL | 6 refills | Status: AC

## 2024-02-28 NOTE — Patient Instructions (Addendum)
 Migraines --> qulipta 60mg  daily + nurtec 75mg  as needed   Continue eliquis  5mg  twice a day   Repeat CT venogram in 6 months (~ Apr / May 2026)

## 2024-02-28 NOTE — Progress Notes (Unsigned)
 [facility]  PATIENT: David [name] DOB: 2000-08-22  REFERRING CLINICIAN: Jerri Pfeiffer, MD HISTORY FROM: patient  REASON FOR VISIT: new consult   HISTORICAL  CHIEF COMPLAINT:  Chief Complaint  Patient presents with   sinus thrombosis    Rm 7 alone  Pt is well, reports ongoing daily headaches with nausea. No other concerns.     HISTORY OF PRESENT ILLNESS:   23 year old male here for evaluation of right-sided headaches and venous sinus thrombosis.  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  In late August 2025 patient had onset of headaches.  He went to urgent care was diagnosed with otitis media of the left ear and started on antibiotics.  Headaches continued and then he presented to the hospital for evaluation.  MRI and MRV show dural venous sinus fibrosis involving the superior sagittal sinus, left transverse sinus and sigmoid sinuses.  He was started on anticoagulation.  He was stabilized and discharged home.  Has been tolerating intake coagulation therapy since that time.  Separately patient has prior history of migraine headaches since age 71 years old consisting of global headaches, visual aura, nausea vomiting.  Has been on sumatriptan  in the past.  Has some sun sensitivity which could trigger headaches in the past.   REVIEW OF SYSTEMS: Full 14 system review of systems performed and negative with exception of: As per HPI.  ALLERGIES: Allergies  Allergen Reactions   Fish Allergy Itching    Pt states he is allergic to lobster     HOME MEDICATIONS: Outpatient Medications Prior to Visit  Medication Sig Dispense Refill   hydroxychloroquine  (PLAQUENIL ) 200 MG tablet Take 200 mg by mouth daily.     mycophenolate  (CELLCEPT ) 500 MG tablet Take 1,000 mg by mouth 2 (two) times daily.     ondansetron  (ZOFRAN -ODT) 4 MG disintegrating tablet Take 1 tablet (4 mg total) by mouth every 8 (eight) hours as needed for nausea or  vomiting. 15 tablet 0   ELIQUIS  5 MG TABS tablet 1 tablet Orally twice a day; Duration: 30 days     APIXABAN  (ELIQUIS ) VTE STARTER PACK (10MG  AND 5MG ) Take as directed on package: start with two-5mg  tablets twice daily for 7 days. On day 8, switch to one-5mg  tablet twice daily. (Patient not taking: Reported on 02/28/2024) 74 each 0   oxyCODONE  (ROXICODONE ) 5 MG immediate release tablet Take 1 tablet (5 mg total) by mouth every 4 (four) hours as needed for severe pain (pain score 7-10). (Patient not taking: Reported on 02/28/2024) 15 tablet 0   No facility-administered medications prior to visit.    PAST MEDICAL HISTORY: Past Medical History:  Diagnosis Date   Lung disease    MCTD (mixed connective tissue disease)     PAST SURGICAL HISTORY: History reviewed. No pertinent surgical history.  FAMILY HISTORY: History reviewed. No pertinent family history.  SOCIAL HISTORY: Social History   Socioeconomic History   Marital status: Single    Spouse name: Not on file   Number of children: Not on file   Years of education: Not on file   Highest education level: Not on file  Occupational History   Not on file  Tobacco Use   Smoking status: Former    Types: Cigarettes   Smokeless tobacco: Never  Vaping Use   Vaping status: Never Used  Substance and Sexual Activity   Alcohol use: Yes    Comment: occassionally   Drug use: Not Currently   Sexual activity:  Yes  Other Topics Concern   Not on file  Social History Narrative   Not on file   Social Drivers of Health   Financial Resource Strain: Not on file  Food Insecurity: Patient Declined (01/07/2024)   Hunger Vital Sign    Worried About Running Out of Food in the Last Year: Patient declined    Ran Out of Food in the Last Year: Patient declined  Transportation Needs: Patient Declined (01/07/2024)   PRAPARE - Administrator, Civil Service (Medical): Patient declined    Lack of Transportation (Non-Medical): Patient declined   Physical Activity: Not on file  Stress: Not on file  Social Connections: Not on file  Intimate Partner Violence: Unknown (01/07/2024)   Humiliation, Afraid, Rape, and Kick questionnaire    Fear of Current or Ex-Partner: Patient declined    Emotionally Abused: Not on file    Physically Abused: Patient declined    Sexually Abused: Patient declined     PHYSICAL EXAM  GENERAL EXAM/CONSTITUTIONAL: Vitals:  Vitals:   02/28/24 1122  BP: 122/81  Pulse: 91  Weight: 145 lb (65.8 kg)  Height: 5' 11 (1.803 m)   Body mass index is 20.22 kg/m. Wt Readings from Last 3 Encounters:  02/28/24 145 lb (65.8 kg)  01/06/24 152 lb 1.9 oz (69 kg)  01/06/24 152 lb 1.9 oz (69 kg)   Patient is in no distress; well developed, nourished and groomed; neck is supple  CARDIOVASCULAR: Examination of carotid arteries is normal; no carotid bruits Regular rate and rhythm, no murmurs Examination of peripheral vascular system by observation and palpation is normal  EYES: Ophthalmoscopic exam of optic discs and posterior segments is normal; no papilledema or hemorrhages No results found.  MUSCULOSKELETAL: Gait, strength, tone, movements noted in Neurologic exam below  NEUROLOGIC: MENTAL STATUS:      No data to display         awake, alert, oriented to person, place and time recent and remote memory intact normal attention and concentration language fluent, comprehension intact, naming intact fund of knowledge appropriate  CRANIAL NERVE:  2nd - no papilledema on fundoscopic exam 2nd, 3rd, 4th, 6th - pupils equal and reactive to light, visual fields full to confrontation, extraocular muscles intact, no nystagmus 5th - facial sensation symmetric 7th - facial strength symmetric 8th - hearing intact 9th - palate elevates symmetrically, uvula midline 11th - shoulder shrug symmetric 12th - tongue protrusion midline  MOTOR:  normal bulk and tone, full strength in the BUE, BLE  SENSORY:   normal and symmetric to light touch, temperature, vibration  COORDINATION:  finger-nose-finger, fine finger movements normal  REFLEXES:  deep tendon reflexes present and symmetric  GAIT/STATION:  narrow based gait    DIAGNOSTIC DATA (LABS, IMAGING, TESTING) - I reviewed patient records, labs, notes, testing and imaging myself where available.  Lab Results  Component Value Date   WBC 10.2 02/18/2024   HGB 15.4 02/18/2024   HCT 45.5 02/18/2024   MCV 86.3 02/18/2024   PLT 244 02/18/2024      Component Value Date/Time   NA 135 02/18/2024 0839   K 4.1 02/18/2024 0839   CL 98 02/18/2024 0839   CO2 25 02/18/2024 0839   GLUCOSE 103 (H) 02/18/2024 0839   BUN 5 (L) 02/18/2024 0839   CREATININE 0.75 02/18/2024 0839   CREATININE 0.46 (L) 10/21/2021 1523   CALCIUM 9.2 02/18/2024 0839   PROT 8.5 (H) 02/18/2024 0839   ALBUMIN  3.9 02/18/2024 0839  AST 29 02/18/2024 0839   ALT 18 02/18/2024 0839   ALKPHOS 84 02/18/2024 0839   BILITOT 0.9 02/18/2024 0839   GFRNONAA >60 02/18/2024 0839   Lab Results  Component Value Date   CHOL 123 01/09/2024   HDL 30 (L) 01/09/2024   LDLCALC 76 01/09/2024   TRIG 83 01/09/2024   CHOLHDL 4.1 01/09/2024   Lab Results  Component Value Date   HGBA1C 4.9 01/09/2024   No results found for: VITAMINB12 Lab Results  Component Value Date   TSH 1.992 12/04/2021   01/09/24 HIV - negative   01/08/24 Hypercoag panel - negative except the following:  Component Ref Range & Units (hover) 1 mo ago  Protein C Activity 71 Low    Component Ref Range & Units (hover) 1 mo ago  Protein S Activity 44 Low    PTT Lupus Anticoagulant 56.4 High   Comment: (NOTE) Additional testing confirms the presence of heparin  in the test sample. Results obtained after heparin  neutralization.  DRVVT 69.4 High   Lupus Anticoag Interp Comment: VC  Comment: (NOTE) No lupus anticoagulant was detected. Mixing studies suggest the presence of an inhibitor and this  could represent a specific factor inhibitor, dabigatran (a direct thrombin inhibitor anticoagulant), or a direct Xa inhibitor anticoagulant therapy such as rivaroxaban, apixaban  or edoxaban. As antibody titers may fluctuate with time, repeat testing may be indicated and ideally should be performed in the absence of anticoagulant therapy.   Component Ref Range & Units (hover) 1 mo ago  PTT-LA Mix 50.4 High       Component Ref Range & Units (hover) 1 mo ago  dRVVT Mix 48.8 High       Component Ref Range & Units (hover) 1 mo ago  dRVVT Confirm 1.2          Component Ref Range & Units (hover) 1 mo ago 2 yr ago  Anti Nuclear Antibody (ANA) Positive Abnormal       Component Ref Range & Units (hover) 1 mo ago 2 yr ago  ds DNA Ab 6 5 CM  Comment: (NOTE)                                   Negative      <5                                   Equivocal  5 - 9                                   Positive      >9  Ribonucleic Protein >8.0 High  >8.0 High   ENA SM Ab Ser-aCnc 1.7 High  >8.0 High   Scleroderma (Scl-70) (ENA) Antibody, IgG 0.7 <0.2  SSA (Ro) (ENA) Antibody, IgG 7.5 High  <0.2  SSB (La) (ENA) Antibody, IgG <0.2 <0.2  Chromatin Ab SerPl-aCnc >8.0 High  >8.0 High   Anti JO-1 <0.2 <0.2  Centromere Ab Screen <0.2      02/18/24 CT venogram 1. Unresolved and extensive dural venous sinus thrombosis involving most of the superior sagittal sinus, the torcula, and left transverse sinus. Partial occlusion of left sigmoid sinus and left internal jugular bulb. Deep venous structures somewhat hypertrophied. 2. Negative CT oappearance of the brain parenchyma;  no new intracranial abnormality.   ASSESSMENT AND PLAN  23 y.o. year old male here with:   Dx:  1. Cerebral venous sinus thrombosis   2. MCTD (mixed connective tissue disease)   3. Migraine with aura and without status migrainosus, not intractable     PLAN:  23 y.o. male with history of mixed connective tissue  disorder and migraines:   dural venous sinus thrombosis involving SSS, left transverse sinus and sigmoid sinus, etiology likely hypercoagulability 2/2 mixed connective tissue disease and dehydration MRI extensive dural sinus thrombosis involving the superior sagittal sinus, left transverse sinus, and left sigmoid sinus. Cortical veins are patent. Left mastoid effusion. No obstructing nasopharyngeal lesion.  MRV dural venous sinus thrombosis involving the majority of the superior sagittal sinus and the dominant left transverse and sigmoid sinuses.  CTA with no LVO or hemodynamically significant stenosis 2D Echo & bubble EF 65-70%, bubble (-) no evidence of interarterial shunt Venous duplex no evidence of DVT bilaterally LDL 76 HgbA1c 4.9 No antithrombotic prior to admission, d/c heparin  IV, started apixaban  10 mg twice daily for 7 days followed by 5 mg twice daily --> long term until repeat CT venogram in 6 months; will repeat hypercoag panel at that time as well   Mixed Connective Tissue Disorder ANA positive  Autoimmune work up positive Continue home CellCept  and Plaquenil  follows with [facility] rheumatology, patient trying to see rheumatologist in [place]  Migraine with aura --> qulipta + nurtec (cannot take topiramate or triptans due to current eliquis  use)   Meds ordered this encounter  Medications   Atogepant (QULIPTA) 60 MG TABS    Sig: Take 1 tablet (60 mg total) by mouth daily.    Dispense:  30 tablet    Refill:  6   Rimegepant Sulfate (NURTEC) 75 MG TBDP    Sig: Take 1 tablet (75 mg total) by mouth daily as needed.    Dispense:  8 tablet    Refill:  6   ELIQUIS  5 MG TABS tablet    Sig: Take 1 tablet (5 mg total) by mouth 2 (two) times daily.    Dispense:  60 tablet    Refill:  12   Return in about 5 months (around 07/28/2024).  I reviewed images, labs, notes, records myself. I summarized findings and reviewed with patient, for this high risk condition (venous sinus  thrombosis) requiring high complexity decision making.    EDUARD FABIENE HANLON, MD 02/28/2024, 12:07 PM Certified in Neurology, Neurophysiology and Neuroimaging  [facility] [address], [address] [place], [place] [address] [phone]

## 2024-03-01 ENCOUNTER — Telehealth: Payer: Self-pay

## 2024-03-01 ENCOUNTER — Other Ambulatory Visit (HOSPITAL_COMMUNITY): Payer: Self-pay

## 2024-03-01 NOTE — Telephone Encounter (Signed)
 Hello Prescriber!  We are in the process of submitting a prior authorization for your patient for Qulipta. We are reaching out for clinical guidance for the following information to complete the request: Insurance is requesting documentation that PT has been utilizing prophylactic intervention modalities (for example, behavioral therapy, physical therapy, and life-style modification    Thank you! Pharmacy Team

## 2024-03-05 NOTE — Telephone Encounter (Signed)
 Hello Prescriber!  We are in the process of submitting a prior authorization for your patient for Qulipta. We are reaching out for clinical guidance for the following information to complete the request: The patient's plan requires documentation showing that patient to has tried and failed, or have contraindication to the following agents before they will approve our request: Aimovig, Ajovy, Emgality. Or please advise if you would like to switch prescribed medication per insurance requirement.   Thank you! Pharmacy Team

## 2024-03-06 NOTE — Telephone Encounter (Signed)
 Pharmacy Patient Advocate Encounter   Received notification from Physician's Office that prior authorization for Mickel is required/requested.   Insurance verification completed.   The patient is insured through [employer].   Per test claim: PA required; PA submitted to above mentioned insurance via Latent Key/confirmation #/[id number] Status is pending

## 2024-03-06 NOTE — Telephone Encounter (Signed)
 Pharmacy Patient Advocate Encounter  Received notification from [employer] that Prior Authorization for David [name] has been DENIED.  Full denial letter will be uploaded to the media tab. See denial reason below.   PA #/Case ID/Reference #: [id number]

## 2024-03-06 NOTE — Telephone Encounter (Signed)
 Dr. Margaret- looks like insurance wants them to try one of the CGRP injectables first. I reviewed chart and do not see where he has tried these. Did you want to change to one of these options since Qulipta denied?

## 2024-03-07 NOTE — Telephone Encounter (Signed)
 I will send to our pharmacist for an appeals review.

## 2024-03-07 NOTE — Telephone Encounter (Signed)
 Thanks

## 2024-03-08 ENCOUNTER — Telehealth: Payer: Self-pay | Admitting: Pharmacist

## 2024-03-08 NOTE — Telephone Encounter (Signed)
 Appeal has been submitted. Will advise when response is received or follow up in 1 week. Please be advised that most companies may take 30 days to make a decision. Appeal letter and supporting documentation have been sent to the insurance via email on 03/08/2024 @2 :27 pm.  Thank you, Devere Pandy, PharmD Clinical Pharmacist  [place]  Direct Dial: [phone]

## 2024-03-15 ENCOUNTER — Other Ambulatory Visit (HOSPITAL_COMMUNITY): Payer: Self-pay

## 2024-03-19 ENCOUNTER — Other Ambulatory Visit (HOSPITAL_COMMUNITY): Payer: Self-pay

## 2024-03-19 ENCOUNTER — Telehealth: Payer: Self-pay

## 2024-03-19 NOTE — Telephone Encounter (Signed)
 Pharmacy Patient Advocate Encounter   Received notification from Cover[employer]Meds that prior authorization for Nurtec is required/requested.   Insurance verification completed.   The patient is insured through [employer].   Per test claim: PA required; PA submitted to above mentioned insurance via Latent Key/confirmation #/EOC [id number] Status is pending

## 2024-03-20 ENCOUNTER — Other Ambulatory Visit (HOSPITAL_COMMUNITY): Payer: Self-pay

## 2024-03-20 NOTE — Telephone Encounter (Signed)
 Pharmacy Patient Advocate Encounter  Received notification from [employer] that Prior Authorization for Nurtec has been APPROVED from 03/19/2024 to 03/19/2025   PA #/Case ID/Reference #: [id number]

## 2024-04-02 ENCOUNTER — Other Ambulatory Visit (HOSPITAL_COMMUNITY): Payer: Self-pay

## 2024-04-02 NOTE — Telephone Encounter (Signed)
 Called to follow up, still under review.

## 2024-04-10 ENCOUNTER — Other Ambulatory Visit (HOSPITAL_COMMUNITY): Payer: Self-pay

## 2024-04-11 ENCOUNTER — Other Ambulatory Visit (HOSPITAL_COMMUNITY): Payer: Self-pay

## 2024-04-15 ENCOUNTER — Other Ambulatory Visit (HOSPITAL_COMMUNITY): Payer: Self-pay

## 2024-04-19 ENCOUNTER — Other Ambulatory Visit (HOSPITAL_COMMUNITY): Payer: Self-pay

## 2024-04-19 ENCOUNTER — Encounter: Payer: Self-pay | Admitting: Pharmacist

## 2024-04-30 ENCOUNTER — Other Ambulatory Visit (HOSPITAL_COMMUNITY): Payer: Self-pay

## 2024-05-06 ENCOUNTER — Other Ambulatory Visit (HOSPITAL_COMMUNITY): Payer: Self-pay

## 2024-05-06 NOTE — Telephone Encounter (Signed)
 Noted thanks! I let the pt know.

## 2024-05-06 NOTE — Telephone Encounter (Signed)
 Appeal for Qulipta  has been approved-

## 2024-07-29 ENCOUNTER — Ambulatory Visit: Admitting: Diagnostic Neuroimaging
